# Patient Record
Sex: Female | Born: 1970
Health system: Southern US, Community
[De-identification: ages and names within clinical notes are randomized; demographics above are authoritative.]

## PROBLEM LIST (undated history)

## (undated) DIAGNOSIS — I10 Essential (primary) hypertension: Secondary | ICD-10-CM

## (undated) DIAGNOSIS — B009 Herpesviral infection, unspecified: Secondary | ICD-10-CM

## (undated) DIAGNOSIS — E079 Disorder of thyroid, unspecified: Secondary | ICD-10-CM

## (undated) HISTORY — DX: Herpesviral infection, unspecified: B00.9

## (undated) HISTORY — DX: Essential (primary) hypertension: I10

## (undated) HISTORY — PX: ABDOMINAL HYSTERECTOMY: SHX81

## (undated) HISTORY — PX: TOTAL ABDOMINAL HYSTERECTOMY: SHX209

## (undated) HISTORY — DX: Disorder of thyroid, unspecified: E07.9

---

## 2000-05-23 ENCOUNTER — Other Ambulatory Visit: Admission: RE | Admit: 2000-05-23 | Discharge: 2000-05-23 | Payer: Self-pay | Admitting: Obstetrics and Gynecology

## 2001-06-18 ENCOUNTER — Other Ambulatory Visit: Admission: RE | Admit: 2001-06-18 | Discharge: 2001-06-18 | Payer: Self-pay | Admitting: Obstetrics and Gynecology

## 2001-11-11 ENCOUNTER — Inpatient Hospital Stay (HOSPITAL_COMMUNITY): Admission: AD | Admit: 2001-11-11 | Discharge: 2001-11-11 | Payer: Self-pay | Admitting: Obstetrics & Gynecology

## 2002-01-31 ENCOUNTER — Encounter: Admission: RE | Admit: 2002-01-31 | Discharge: 2002-05-01 | Payer: Self-pay | Admitting: Obstetrics and Gynecology

## 2002-05-08 ENCOUNTER — Inpatient Hospital Stay (HOSPITAL_COMMUNITY): Admission: AD | Admit: 2002-05-08 | Discharge: 2002-05-10 | Payer: Self-pay | Admitting: Obstetrics and Gynecology

## 2002-06-18 ENCOUNTER — Other Ambulatory Visit: Admission: RE | Admit: 2002-06-18 | Discharge: 2002-06-18 | Payer: Self-pay | Admitting: Obstetrics and Gynecology

## 2003-12-23 ENCOUNTER — Other Ambulatory Visit: Admission: RE | Admit: 2003-12-23 | Discharge: 2003-12-23 | Payer: Self-pay | Admitting: Obstetrics and Gynecology

## 2004-07-06 ENCOUNTER — Other Ambulatory Visit: Admission: RE | Admit: 2004-07-06 | Discharge: 2004-07-06 | Payer: Self-pay | Admitting: Obstetrics and Gynecology

## 2005-04-16 ENCOUNTER — Ambulatory Visit (HOSPITAL_COMMUNITY): Admission: RE | Admit: 2005-04-16 | Discharge: 2005-04-16 | Payer: Self-pay | Admitting: Emergency Medicine

## 2006-03-20 ENCOUNTER — Other Ambulatory Visit: Admission: RE | Admit: 2006-03-20 | Discharge: 2006-03-20 | Payer: Self-pay | Admitting: Gynecology

## 2007-04-22 ENCOUNTER — Inpatient Hospital Stay (HOSPITAL_COMMUNITY): Admission: RE | Admit: 2007-04-22 | Discharge: 2007-04-24 | Payer: Self-pay | Admitting: Gynecology

## 2007-04-22 ENCOUNTER — Encounter: Payer: Self-pay | Admitting: Gynecology

## 2007-05-30 ENCOUNTER — Other Ambulatory Visit: Admission: RE | Admit: 2007-05-30 | Discharge: 2007-05-30 | Payer: Self-pay | Admitting: Gynecology

## 2008-10-09 DIAGNOSIS — E079 Disorder of thyroid, unspecified: Secondary | ICD-10-CM

## 2008-10-09 HISTORY — DX: Disorder of thyroid, unspecified: E07.9

## 2008-11-19 ENCOUNTER — Encounter (HOSPITAL_COMMUNITY): Admission: RE | Admit: 2008-11-19 | Discharge: 2009-02-17 | Payer: Self-pay | Admitting: Endocrinology

## 2008-12-16 ENCOUNTER — Encounter: Payer: Self-pay | Admitting: Endocrinology

## 2011-01-19 LAB — HCG, SERUM, QUALITATIVE: Preg, Serum: NEGATIVE

## 2011-02-21 NOTE — Op Note (Signed)
Wendy Craig, Wendy Craig              ACCOUNT NO.:  [id number]   MEDICAL RECORD NO.:  [id number]          PATIENT TYPE:  INP   LOCATION:  9302                          FACILITY:  [facility]   PHYSICIAN:  Timothy P. Fontaine, M.D.DATE OF BIRTH:  [birth year]   DATE OF PROCEDURE:  04/22/2007  DATE OF DISCHARGE:                               OPERATIVE REPORT   PREOPERATIVE DIAGNOSES:  Increasing menorrhagia, dysmenorrhea,  persistent ovarian cystic mass.   POSTOPERATIVE DIAGNOSES:  Endometriosis with bilateral endometriomas,  pelvic adhesions obliterating posterior cul-de-sac.   PROCEDURE:  Laparoscopy, total abdominal hysterectomy, bilateral  salpingo-oophorectomy, lysis of adhesions.   SURGEON:  Dr. Audie Box   ASSISTANT:  Dr. Eda Paschal.   ANESTHETIC:  General.   ESTIMATED BLOOD LOSS:  Approximately 500 mL   COMPLICATIONS:  None.   SPECIMEN:  Uterus, bilateral fallopian tubes, bilateral ovaries. cul de  sac cyst wall   FINDINGS:  Left ovary has 6 cm cyst adherent to the left pelvic sidewall  and posterior cul-de-sac. Right ovary approximately 4-5 cm, cystic in  nature adherent to the right pelvic sidewall and posterior cul-de-sac.  Right and left fallopian tubes both adherent to ovaries and posterior  cul-de-sac obliterating the posterior cul-de-sac.  Sigmoid colon  involved in this inflammatory mass with filmy adhesions along the  posterior uterine aspect and the sigmoid, right and left fallopian  tubes, right and left ovaries.  Uterus itself grossly normal in size,  shape and contour, mobile, covered with a brownish fibrinous exudate  consistent with endometriosis.  Anterior cul-de-sac grossly normal.  No  gross peritoneal endometriosis noted.  Upper abdominal exam was grossly  normal to visual inspection.   PROCEDURE:  The patient was taken to the operating room, underwent  general endotracheal anesthesia, placed low dorsal lithotomy position,  received abdominal perineal vaginal  preparation with Betadine solution.  Bladder emptied with indwelling Foley catheterization.  The Hulka  tenaculum placed on the cervix.  The patient was draped in usual  fashion.  A vertical infraumbilical incision was made using the 10-mm  OptiVu type direct entry trocar. The abdomen was directly entered under  direct visualization without difficulty.  The abdomen was then  insufflated and right and left 5-mm suprapubic ports were then placed  under direct visualization after transillumination for the vessels  without difficulty.  Examination of pelvic organs was then performed  with findings noted above.  An attempt to proceed with an LAVH BSO was  undertaken. The left adnexa was mobilized after lysis of the sigmoid,  posterior cul-de-sac, ovarian, fallopian tube adhesions to the retracted  the bowel using the harmonic scalpel.  The left adnexa was then freed  after identification of the ureter and the infundibulopelvic ligament  and vessels which was isolated and subsequently transected using the  harmonic scalpel.  The ovary was adherent to the posterior cul-de-sac  which was bluntly and sharply freed and it was apparent that there is  some remaining ovarian capsule had remained within the cul-de-sac with  an intense inflammatory state.  The left round ligament was transected  with the harmonic scalpel and the anterior  vesicouterine peritoneal  reflection was sharply incised along the anterior surface of the uterus  to develop the bladder flap.  Attention was then turned to the right  adnexa and upon attempting to free the adnexa became apparent that the  firm adhesions and inflammatory nature of the tissues.  It became  impossible to identify the ureter as was identified on the left. At this  point it became apparent that there was no longer prudent to proceed  with the laparoscopic portion of the case.  At this time after preparing  for exploratory laparotomy.  The abdomen sharply  entered through a  Pfannenstiel's type incision combining the two 5 mm ports into the  incision line.  The abdomen was sharply entered and a Balfour retractor  and bladder blade were placed within the incision.  The pelvis was  copiously irrigated and subsequently the ureter was identified through  blunt dissection into the pelvic sidewall on the peritoneal flap and  found to be away from the infundibulopelvic ligament vessels.  This  point the infundibulopelvic ligament vessels were doubly clamped, cut  and ligated using 0 Vicryl suture x2.  The adnexa was progressively  freed. It was at one point an endometrioma was entered to the level of  the round ligament which was transected with electrocautery.  The  parametrial tissues were developed and the uterine vessels were  identified and bilaterally clamped, cut and ligated using 0 Vicryl  suture x2.  The bladder flap was progressively developed sharply and the  uterus was progressively freed from its attachments through clamping,  cutting and ligating of the parametrial paracervical tissues using 0  Vicryl suture.  To aid in visualization, a supracervical hysterectomy  was initially performed and subsequently the cervical stump was excised  after entering the vagina sharply circumferentially excising the  cervical stump.  Right and left vaginal angle sutures were placed using  0 Vicryl suture and tagged for future reference and the vagina was  closed anterior to posterior using 0 Vicryl suture in interrupted figure-  of-eight stitch.  Reinspection of the pelvis revealed remaining apparent  endometriotic cyst wall within the obliterated cul-de-sac.  This was  bluntly and sharply removed to leave no overt remaining endometriotic or  ovarian tissue.  The pelvis was copiously irrigated and the cul-de-sac  showed adequate hemostasis to visualization.  Surgicel was placed within  the posterior cul-de-sac prophylactically given the intense  inflammatory  nature of the dissection in the event of subsequent oozing. Again no  overt oozing was noted at the time of surgery.  All pedicles were  reinspected showing adequate hemostasis. Bowel packing that was  initially placed after placement of the Balfour was removed.  The  Balfour retractor and bladder blade were removed after assuring proper  needle, sponge, instrument count x2.  The anterior fascia was  reapproximated using 0 Vicryl suture in a running interlocking stitch  starting at the angle, meeting in the middle.  The subcutaneous tissues  were irrigated.  Hemostasis achieved electrocautery and the skin was  reapproximated using 4-0 Vicryl running subcuticular stitch.  Benzoin,  Steri-Strips were applied.  The infraumbilical port was then closed  using 0 Vicryl in a subcutaneous fascial stitch followed by interrupted  4-0 Vicryl skin sutures.  Sterile dressing was applied and the patient  was placed in the supine position, awakened without difficulty and taken  to recovery room in good condition having tolerated procedure well with  an excess of 200 mL of  clear yellow urine.      Timothy P. Fontaine, M.D.  Electronically Signed     TPF/MEDQ  D:  04/22/2007  T:  04/22/2007  Job:  [id number]

## 2011-02-21 NOTE — Discharge Summary (Signed)
Wendy Craig, Wendy Craig              ACCOUNT NO.:  [id number]   MEDICAL RECORD NO.:  [id number]          PATIENT TYPE:  INP   LOCATION:  9302                          FACILITY:  [facility]   PHYSICIAN:  Timothy P. Fontaine, M.D.DATE OF BIRTH:  1971/09/24   DATE OF ADMISSION:  04/22/2007  DATE OF DISCHARGE:  04/24/2007                               DISCHARGE SUMMARY   DISCHARGE DIAGNOSES:  Metrorrhagia, dysmenorrhea, endometriosis with  endometriomas, pelvic adhesions.   PROCEDURE:  Laparoscopy, total abdominal hysterectomy, bilateral  salpingo-oophorectomy, pathology pending.   HOSPITAL COURSE:  40 year old G2, P2 female with increasing menorrhagia,  dysmenorrhea, ovarian cystic masses admitted for attempted LAVH BSO.  Upon entry with the laparoscopy there was significant severe  endometriosis, bilateral endometriomas with posterior cul-de-sac  obliteration and it was determined to be necessary to proceed with an  abdominal hysterectomy.  The patient underwent an uncomplicated TAH BSO  April 23, 2007.  The patient's postoperative course was uncomplicated.  She was noted to have a labile blood pressure she would run in the  140/80-150/90-100 range asymptomatic and historically she notes that she  does have a history of labile blood pressure but is untreated.  The  patient's preoperative hemoglobin was 11.  Her postoperative hemoglobin  was 8.8.  She was discharged on postoperative day #2 ambulating well,  tolerating a regular diet, passing flatus, voiding without difficulty.  She received routine precautions, instructions and follow-up will be  seen in the office 2 weeks after discharge and was given a prescription  for Tylox #25 one to two p.o. q.4-6 hours p.r.n. pain.  We discussed her  blood pressure, I gave her signs and symptoms of elevated blood  pressure.  She normally seen at [facility] [address].  She is able to  monitor her blood pressure at home. I have asked her to monitor blood  pressure and gave her limit ASAP call numbers and she is to follow-up  with her urgent care for continued blood pressure monitoring and  possible therapy if it does remain elevated.  The patient's questions  were answered and she will follow-up in 2 weeks.      Timothy P. Fontaine, M.D.  Electronically Signed     TPF/MEDQ  D:  04/24/2007  T:  04/24/2007  Job:  [id number]

## 2011-02-21 NOTE — H&P (Signed)
Wendy Craig, Wendy Craig              ACCOUNT NO.:  [id number]   MEDICAL RECORD NO.:  [id number]          PATIENT TYPE:  AMB   LOCATION:  SDC                           [facility]   PHYSICIAN:  Timothy P. Fontaine, M.D.DATE OF BIRTH:  1971/02/17   DATE OF ADMISSION:  04/22/2007  DATE OF DISCHARGE:                              HISTORY & PHYSICAL   CHIEF COMPLAINT:  Pelvic pain, menorrhagia, ovarian cystic mass.   HISTORY OF PRESENT ILLNESS:  A 40 year old G2 P2 female, barrier  contraception who has a long history of increasing menorrhagia that is  becoming socially unacceptable.  She has an ultrasound that shows  evidence of adenomyosis in the myometrial pattern but negative  sonohysterogram and a normal thyroid panel.  She most recently was  evaluated due to abdominal pain in March 2008 at which time she  underwent a CT which showed a 6 cm complex cystic mass on the ovary.  A  follow-up ultrasound confirmed a 6 cm complex mass, solid in appearance  with some septations, negative color flow Doppler.  She had a normal CA-  125 at 16.  The patient is admitted at this time for laparoscopic  assisted vaginal hysterectomy, bilateral salpingo-oophorectomy due to  her increasing menorrhagia, pelvic discomfort and the cystic mass.  The  patient was counseled as to the alternatives to include removal of the  cystic mass alone with a tubal sterilization; removal of the mass, tubal  sterilization, endometrial ablation up to and including hysterectomy  with removal of the right cystic mass and ovary leaving the left normal-  appearing ovary for continued hormone production.  The patient feels  very strongly that she wants both ovaries removed regardless of findings  at the time of surgery as discussed in the assessment and plan.   PAST MEDICAL HISTORY:  Is uncomplicated.   PAST SURGICAL HISTORY:  Uncomplicated.   ALLERGIES:  None.   CURRENT MEDICATIONS:  None.   REVIEW OF SYSTEMS:   Noncontributory.   SOCIAL HISTORY:  Noncontributory.   FAMILY HISTORY:  Noncontributory.   PHYSICAL EXAM:  VITAL SIGNS:  Afebrile vital signs stable.  HEENT: Normal.  LUNGS:  Clear.  CARDIAC: Regular rate.  No rubs, murmurs or gallops.  ABDOMEN:  Benign.  PELVIC:  External BUS, vagina grossly normal.  Cervix normal.  Uterus  grossly normal in size. Cul-de-sac somewhat tender to palpation.  Adnexa  without gross masses or tenderness.   ASSESSMENT:  40 year old G2, P2 female, barrier contraception increasing  menorrhagia, pelvic discomfort, cystic mass on the right ovary 6 cm for  LAVH BSO.  I reviewed the risks, benefits, alternatives for the surgery  to include more conservative approach such as removal of the cystic mass  alone with or without tubal sterilization, endometrial ablation for  menorrhagia control with removal of the mass with or without tubal  sterilization up to and including hysterectomy, BSO.  The patient feels  very strongly she wants both ovaries removed.  She had the issues of  ovarian conservation reviewed to keep one ovary for continued hormone  production with the risks of benign  or malignant ovarian disease in the  future versus removal of both ovaries and the issues of hypoestrogenemia  both on the short symptomatic standpoint, hot flashes, sleep  disturbances, sweating.  Longer-term issues such as vaginal dryness,  long-term health issues as far as heart and bones are concerned.  The  [facility] study results were reviewed with her and the issues of estrogen  replacement therapy, possible breast cancer issues, long-term as well as  the risks of thrombosis, stroke, heart attack, DVT were all reviewed.  The patient feels very strongly she wants both ovaries removed  regardless of findings at the time of surgery and will consider estrogen  replacement therapy following this.  The absolute sterility associated  with hysterectomy was reviewed with her.  She  understands and accepts  this.  Sexuality following hysterectomy was reviewed, the potential for  orgasmic dysfunction and persistent dyspareunia following hysterectomy  was discussed, understood and accepted.  The acute intraoperative and  postoperative courses were reviewed to include instrumentation, trocar  placement, insufflation, multiple port sites, use of sharp and blunt  dissection, electrocautery were all reviewed with her.  The patient  understands that we will attempt LAVH BSO but any time during the  procedure if I feel it is unsafe to proceed with the laparoscopic  approach or complications arise that we may pursue an exploratory  laparotomy and she understands and accepts this.  The acute risks of  inadvertent injury to internal organs including bowel, bladder, ureters,  vessels and nerves necessitating major exploratory reparative surgeries,  future reparative surgeries either immediately recognized or delay  recognized up to including bowel resection, ostomy formation was all  discussed, understood and accepted.  The risks of hemorrhage  necessitating transfusion and risks of transfusion including transfusion  reaction, hepatitis, HIV, Mad Cow disease and other unknown entities was  all discussed, understood and accepted.  The risks of infection both  internal requiring prolonged antibiotics, abscess formation requiring  reoperation, abscess drainage as well as insertion of incisional  complications requiring opening and draining of incisions closure by  secondary intention.  The long-term issues of hernia formation was all  discussed, understood and accepted.  Again we reviewed the issue of  bilateral salpingo-oophorectomy, particularly at her young age and the  patient is very adamant about removing both ovaries to avoid the future  risk of ovarian disease or ovarian malignancy, although she has received  this is no guarantees as far as pelvic pathology or developing  other  carcinomas and she understands, accepts this.      Timothy P. Fontaine, M.D.  Electronically Signed     TPF/MEDQ  D:  04/16/2007  T:  04/16/2007  Job:  [id number]

## 2011-07-24 LAB — CBC
Hemoglobin: 8.8 — ABNORMAL LOW
MCHC: 33.4
RBC: 3.44 — ABNORMAL LOW

## 2011-07-25 LAB — CBC
HCT: 35.2 — ABNORMAL LOW
Hemoglobin: 11.6 — ABNORMAL LOW
MCHC: 32.9
MCV: 75.2 — ABNORMAL LOW
RBC: 4.67

## 2011-12-06 ENCOUNTER — Other Ambulatory Visit: Payer: Self-pay | Admitting: Family Medicine

## 2011-12-06 DIAGNOSIS — Z1231 Encounter for screening mammogram for malignant neoplasm of breast: Secondary | ICD-10-CM

## 2011-12-12 ENCOUNTER — Ambulatory Visit (INDEPENDENT_AMBULATORY_CARE_PROVIDER_SITE_OTHER): Payer: 59 | Admitting: Family Medicine

## 2011-12-12 DIAGNOSIS — I10 Essential (primary) hypertension: Secondary | ICD-10-CM | POA: Insufficient documentation

## 2011-12-12 DIAGNOSIS — E1165 Type 2 diabetes mellitus with hyperglycemia: Secondary | ICD-10-CM | POA: Insufficient documentation

## 2011-12-12 DIAGNOSIS — E890 Postprocedural hypothyroidism: Secondary | ICD-10-CM | POA: Insufficient documentation

## 2011-12-12 DIAGNOSIS — E039 Hypothyroidism, unspecified: Secondary | ICD-10-CM | POA: Insufficient documentation

## 2011-12-12 DIAGNOSIS — B009 Herpesviral infection, unspecified: Secondary | ICD-10-CM | POA: Insufficient documentation

## 2011-12-12 MED ORDER — LEVOTHYROXINE SODIUM 50 MCG PO TABS: 50.0000 ug | ORAL_TABLET | Freq: Every day | ORAL | Status: DC

## 2011-12-12 MED ORDER — METFORMIN HCL 1000 MG PO TABS: 1000.0000 mg | ORAL_TABLET | Freq: Two times a day (BID) | ORAL | Status: DC

## 2011-12-12 NOTE — Patient Instructions (Signed)
Diets for Diabetes, Food Labeling Look at food labels to help you decide how much of a product you can eat. You will want to check the amount of total carbohydrate in a serving to see how the food fits into your meal plan. In the list of ingredients, the ingredient present in the largest amount by weight must be listed first, followed by the other ingredients in descending order. STANDARD OF IDENTITY Most products have a list of ingredients. However, foods that the Food and Drug Administration (FDA) has given a standard of identity do not need a list of ingredients. A standard of identity means that a food must contain certain ingredients if it is called a particular name. Examples are mayonnaise, peanut butter, ketchup, jelly, and cheese. LABELING TERMS There are many terms found on food labels. Some of these terms have specific definitions. Some terms are regulated by the FDA, and the FDA has clearly specified how they can be used. Others are not regulated or well-defined and can be misleading and confusing. SPECIFICALLY DEFINED TERMS Nutritive Sweetener.  A sweetener that contains calories,such as table sugar or honey.  Nonnutritive Sweetener.  A sweetener with few or no calories,such as saccharin, aspartame, sucralose, and cyclamate.  LABELING TERMS REGULATED BY THE FDA Free.  The product contains only a tiny or small amount of fat, cholesterol, sodium, sugar, or calories. For example, a "fat-free" product will contain less than 0.5 g of fat per serving.  Low.  A food described as "low" in fat, saturated fat, cholesterol, sodium, or calories could be eaten fairly often without exceeding dietary guidelines. For example, "low in fat" means no more than 3 g of fat per serving.  Lean.  "Lean" and "extra lean" are U.S. Department of Agriculture Scientist, research (physical sciences)) terms for use on meat and poultry products. "Lean" means the product contains less than 10 g of fat, 4 g of saturated fat, and 95 mg of  cholesterol per serving. "Lean" is not as low in fat as a product labeled "low."  Extra Lean.  "Extra lean" means the product contains less than 5 g of fat, 2 g of saturated fat, and 95 mg of cholesterol per serving. While "extra lean" has less fat than "lean," it is still higher in fat than a product labeled "low."  Reduced, Less, Fewer.  A diet product that contains 25% less of a nutrient or calories than the regular version. For example, hot dogs might be labeled "25% less fat than our regular hot dogs."  Light/Lite.  A diet product that contains ? fewer calories or  the fat of the original. For example, "light in sodium" means a product with  the usual sodium.  More.  One serving contains at least 10% more of the daily value of a vitamin, mineral, or fiber than usual.  Good Source Of.  One serving contains 10% to 19% of the daily value for a particular vitamin, mineral, or fiber.  Excellent Source Of.  One serving contains 20% or more of the daily value for a particular nutrient. Other terms used might be "high in" or "rich in."  Enriched or Fortified.  The product contains added vitamins, minerals, or protein. Nutrition labeling must be used on enriched or fortified foods.  Imitation.  The product has been altered so that it is lower in protein, vitamins, or minerals than the usual food,such as imitation peanut butter.  Total Fat.  The number listed is the total of all fat found in a serving  of the product. Under total fat, food labels must list saturated fat and trans fat, which are associated with raising bad cholesterol and an increased risk of heart blood vessel disease.  Saturated Fat.  Mainly fats from animal-based sources. Some examples are red meat, cheese, cream, whole milk, and coconut oil.  Trans Fat.  Found in some fried snack foods, packaged foods, and fried restaurant foods. It is recommended you eat as close to 0 g of trans fat as possible, since it raises bad  cholesterol and lowers good cholesterol.  Polyunsaturated and Monounsaturated Fats.  More healthful fats. These fats are from plant sources.  Total Carbohydrate.  The number of carbohydrate grams in a serving of the product. Under total carbohydrate are listed the other carbohydrate sources, such as dietary fiber and sugars.  Dietary Fiber.  A carbohydrate from plant sources.  Sugars.  Sugars listed on the label contain all naturally occurring sugars as well as added sugars.  LABELING TERMS NOT REGULATED BY THE FDA Sugarless.  Table sugar (sucrose) has not been added. However, the manufacturer may use another form of sugar in place of sucrose to sweeten the product. For example, sugar alcohols are used to sweeten foods. Sugar alcohols are a form of sugar but are not table sugar. If a product contains sugar alcohols in place of sucrose, it can still be labeled "sugarless."  Low Salt, Salt-Free, Unsalted, No Salt, No Salt Added, Without Added Salt.  Food that is usually processed with salt has been made without salt. However, the food may contain sodium-containing additives, such as preservatives, leavening agents, or flavorings.  Natural.  This term has no legal meaning.  Organic.  Foods that are certified as organic have been inspected and approved by the USDA to ensure they are produced without pesticides, fertilizers containing synthetic ingredients, bioengineering, or ionizing radiation.  Document Released: 09/28/2003 Document Revised: 09/14/2011 Document Reviewed: 04/15/2009 [facility] Patient Information 2012 [place], [place]    .Diabetes and Exercise Regular exercise is important and can help:   Control blood glucose (sugar).   Decrease blood pressure.    Control blood lipids (cholesterol, triglycerides).   Improve overall health.  BENEFITS FROM EXERCISE  Improved fitness.   Improved flexibility.   Improved endurance.   Increased bone density.   Weight control.    Increased muscle strength.   Decreased body fat.   Improvement of the body's use of insulin, a hormone.   Increased insulin sensitivity.   Reduction of insulin needs.   Reduced stress and tension.   Helps you feel better.  People with diabetes who add exercise to their lifestyle gain additional benefits, including:  Weight loss.   Reduced appetite.   Improvement of the body's use of blood glucose.   Decreased risk factors for heart disease:   Lowering of cholesterol and triglycerides.   Raising the level of good cholesterol (high-density lipoproteins, HDL).   Lowering blood sugar.   Decreased blood pressure.  TYPE 1 DIABETES AND EXERCISE  Exercise will usually lower your blood glucose.   If blood glucose is greater than 240 mg/dl, check urine ketones. If ketones are present, do not exercise.   Location of the insulin injection sites may need to be adjusted with exercise. Avoid injecting insulin into areas of the body that will be exercised. For example, avoid injecting insulin into:   The arms when playing tennis.   The legs when jogging. For more information, discuss this with your caregiver.   Keep a record of:  Food intake.   Type and amount of exercise.   Expected peak times of insulin action.   Blood glucose levels.  Do this before, during, and after exercise. Review your records with your caregiver. This will help you to develop guidelines for adjusting food intake and insulin amounts.  TYPE 2 DIABETES AND EXERCISE  Regular physical activity can help control blood glucose.   Exercise is important because it may:   Increase the body's sensitivity to insulin.   Improve blood glucose control.   Exercise reduces the risk of heart disease. It decreases serum cholesterol and triglycerides. It also lowers blood pressure.   Those who take insulin or oral hypoglycemic agents should watch for signs of hypoglycemia. These signs include dizziness,  shaking, sweating, chills, and confusion.   Body water is lost during exercise. It must be replaced. This will help to avoid loss of body fluids (dehydration) or heat stroke.  Be sure to talk to your caregiver before starting an exercise program to make sure it is safe for you. Remember, any activity is better than none.  Document Released: 12/16/2003 Document Revised: 09/14/2011 Document Reviewed: 04/01/2009 [facility] Patient Information 2012 [place], [place].

## 2011-12-12 NOTE — Progress Notes (Signed)
  Subjective:    Patient ID: Wendy Craig, female    DOB: 1970-12-28, 41 y.o.   MRN: [id number]  HPI  This 41 yo. AA female has Type 2 Diabetes and Hypothyroidism but has not been consistent  with her meds. She resumed them daily about 1 week ago. She has been taking her BP med daily.  She comes in today because she needs RFs for Metformin and Synthroid. She is not monitoring  FSBS at home. She denies hypoglycemic symptoms. Not following a meal plan and not exercising  regularly though she plans to do adopt healthier lifestyle in order to lose some weight . Her ultimate  goal is to be able to discontinue some medications. Foot exams reveal no lesions/ulcerd or loss  of sensation.   Review of Systems As per HPI     Objective:   Physical Exam  Nursing note and vitals reviewed. Constitutional: She is oriented to person, place, and time. She appears well-developed and well-nourished. No distress.  HENT:  Head: Normocephalic and atraumatic.  Eyes: Conjunctivae and EOM are normal. No scleral icterus.  Cardiovascular: Normal rate.   Pulmonary/Chest: Effort normal. No respiratory distress.  Neurological: She is alert and oriented to person, place, and time. No cranial nerve deficit.  Psychiatric: She has a normal mood and affect. Her behavior is normal. Thought content normal.   Results for orders placed in visit on 12/12/11  POCT GLYCOSYLATED HEMOGLOBIN (HGB A1C)      Component Value Range   Hemoglobin A1C 12.4    POCT CBG (FASTING - GLUCOSE)-MANUAL ENTRY      Component Value Range   Glucose Fasting, POC 169 (*) 70 - 99 (mg/dL)          Assessment & Plan:       1. Type II or unspecified type diabetes mellitus without mention of complication, uncontrolled  Pt advised of elevated A1c and the necessity of medication and lifestyle compliance. Need to monitor FSBS. RTC 6-8 weeks   2. Hypothyroidism  RF:  Levothyroxine at current dose.

## 2011-12-13 ENCOUNTER — Other Ambulatory Visit: Payer: Self-pay | Admitting: Family Medicine

## 2011-12-13 ENCOUNTER — Ambulatory Visit
Admission: RE | Admit: 2011-12-13 | Discharge: 2011-12-13 | Disposition: A | Discharge status: Home / Self Care | Payer: Self-pay | Source: Ambulatory Visit | Attending: Family Medicine | Admitting: Family Medicine

## 2011-12-13 DIAGNOSIS — Z1231 Encounter for screening mammogram for malignant neoplasm of breast: Secondary | ICD-10-CM

## 2011-12-14 ENCOUNTER — Other Ambulatory Visit: Payer: Self-pay | Admitting: Family Medicine

## 2011-12-14 ENCOUNTER — Telehealth: Payer: Self-pay

## 2011-12-14 DIAGNOSIS — R928 Other abnormal and inconclusive findings on diagnostic imaging of breast: Secondary | ICD-10-CM

## 2011-12-14 NOTE — Telephone Encounter (Signed)
Pt calling saying that the metphormin that we prescribed for her is not the xr so we need to change to the xr for her the other type upsets her stomache

## 2011-12-15 ENCOUNTER — Telehealth: Payer: Self-pay

## 2011-12-15 MED ORDER — METFORMIN HCL ER (MOD) 500 MG PO TB24: 500.0000 mg | ORAL_TABLET | Freq: Every day | ORAL | Status: DC

## 2011-12-15 MED ORDER — LEVOTHYROXINE SODIUM 75 MCG PO TABS: 75.0000 ug | ORAL_TABLET | Freq: Every day | ORAL | Status: DC

## 2011-12-15 NOTE — Telephone Encounter (Signed)
Pt notified that meds were sent in correctly.

## 2011-12-15 NOTE — Telephone Encounter (Signed)
Patient states Metformin is supposed to be XR so wants to know if we can send new RX in Metformin XR 1000mg  po bid and also the synthroid that was sent in is supposed to be synthroid not . Please advise

## 2011-12-15 NOTE — Telephone Encounter (Signed)
Correct meds sent in- please tell her we are sorry.

## 2011-12-15 NOTE — Telephone Encounter (Signed)
Pharmacist called to verify medications sent by Dr Audria Nine at [facility]. Pt is stating that she thought the Levothroxine was supposed to stay the same, and the Metformin was supposed to be XR, not immediate release. Gave information to change levo Rx back to 75 mcg per Dr McPherson's OV note to keep pt at current dose. Called and spoke with Dr Audria Nine at 104 to ask about Metformin, and she instr'd that she did intend to inc strength of metformin to 1000 mg BID and in doing so, also wanted it changed to immediate release instead of the XR she had been on at the 500 mg strength. Called pharmacy back and instr'd to fill as Rx'd.

## 2011-12-18 ENCOUNTER — Ambulatory Visit
Admission: RE | Admit: 2011-12-18 | Discharge: 2011-12-18 | Disposition: A | Discharge status: Home / Self Care | Payer: 59 | Source: Ambulatory Visit | Attending: Family Medicine | Admitting: Family Medicine

## 2011-12-18 DIAGNOSIS — R928 Other abnormal and inconclusive findings on diagnostic imaging of breast: Secondary | ICD-10-CM

## 2012-01-19 ENCOUNTER — Encounter: Payer: Self-pay | Admitting: Family Medicine

## 2012-01-19 ENCOUNTER — Ambulatory Visit (INDEPENDENT_AMBULATORY_CARE_PROVIDER_SITE_OTHER): Payer: 59 | Admitting: Family Medicine

## 2012-01-19 VITALS — BP 132/90 | HR 83 | Temp 97.9°F | Resp 16 | Ht 62.0 in | Wt 132.8 lb

## 2012-01-19 DIAGNOSIS — E119 Type 2 diabetes mellitus without complications: Secondary | ICD-10-CM

## 2012-01-19 DIAGNOSIS — E039 Hypothyroidism, unspecified: Secondary | ICD-10-CM

## 2012-01-19 NOTE — Progress Notes (Signed)
  Subjective:    Patient ID: Wendy Craig, female    DOB: 1971-04-04, 41 y.o.   MRN: [id number]  HPI  This 41 y.o. AA female has uncontrolled Type II Diabetes and is here today for A1c; last A1c   about 1 month ago was 12.4% Pt practices good nutrition and has found that she is very sensitive to  carbs. She limits starches and has to skip a Metformin dose if her FSBS is < 100 before a meal.  FSBS at home= 80- 140.   Review of SystemsNoncontributory    Objective:   Physical Exam  Vitals reviewed. Constitutional: She is oriented to person, place, and time. She appears well-developed and well-nourished. No distress.  HENT:  Head: Normocephalic and atraumatic.  Eyes: Conjunctivae are normal. No scleral icterus.  Cardiovascular: Normal rate.   Pulmonary/Chest: Effort normal. No respiratory distress.  Neurological: She is alert and oriented to person, place, and time. No cranial nerve deficit.    Results for orders placed in visit on 01/19/12  POCT GLYCOSYLATED HEMOGLOBIN (HGB A1C)      Component Value Range   Hemoglobin A1C 9.8           Assessment & Plan:   1. Hypothyroidism  TSH  2. Type 2 diabetes mellitus  Basic metabolic panel ; continue current medications and nutrition restrictions. Weight down ~ 10 lbs in 11 months.  RTC 3 months

## 2012-01-19 NOTE — Patient Instructions (Signed)

## 2012-01-20 LAB — BASIC METABOLIC PANEL
CO2: 26 mEq/L (ref 19–32)
Chloride: 105 mEq/L (ref 96–112)
Glucose, Bld: 132 mg/dL — ABNORMAL HIGH (ref 70–99)
Potassium: 4 mEq/L (ref 3.5–5.3)
Sodium: 140 mEq/L (ref 135–145)

## 2012-01-23 ENCOUNTER — Ambulatory Visit: Payer: 59 | Admitting: Family Medicine

## 2012-01-24 NOTE — Progress Notes (Signed)
Quick Note:  Please call pt and advise that the following labs are abnormal...  Your blood sugar is elevated ( this is no surprise as you do have diabetes). Continue all your current medications and work on healthier lifestyle, nutrition and regular exercise.  Copy of labs to pt. ______

## 2012-04-19 ENCOUNTER — Encounter: Payer: Self-pay | Admitting: Family Medicine

## 2012-04-19 ENCOUNTER — Ambulatory Visit (INDEPENDENT_AMBULATORY_CARE_PROVIDER_SITE_OTHER): Payer: 59 | Admitting: Family Medicine

## 2012-04-19 VITALS — BP 134/98 | HR 72 | Temp 98.3°F | Resp 18 | Ht 61.0 in | Wt 136.0 lb

## 2012-04-19 MED ORDER — LEVOTHYROXINE SODIUM 75 MCG PO TABS: 75.0000 ug | ORAL_TABLET | Freq: Every day | ORAL | Status: DC

## 2012-04-19 MED ORDER — METFORMIN HCL ER (MOD) 500 MG PO TB24: 500.0000 mg | ORAL_TABLET | Freq: Every day | ORAL | Status: DC

## 2012-04-19 MED ORDER — LISINOPRIL-HYDROCHLOROTHIAZIDE 20-12.5 MG PO TABS: 1.0000 | ORAL_TABLET | Freq: Every day | ORAL | Status: DC

## 2012-04-19 NOTE — Progress Notes (Signed)
S; Pt returns for DM follow-up. She admits checking sugars daily but takes medication only is sugar is elevated. No regular exercise or meal planning. Not taking anti-hypertensive daily either. Denies HA, CP or tightness, SOB, palpitations, edema, dizziness, lightheadedness, weakness or fatigue.  O: Vital signs reviewed  (BP= 134/ 98)  In NAD      WN.WD      HEENT: EOMI, conj/ sclerae clear      COR: Reg rate and rhythm      LUNGS: Reg resp rate and effort      NEURO: Nonfocal   Results for orders placed in visit on 04/19/12  POCT GLYCOSYLATED HEMOGLOBIN (HGB A1C)      Component Value Range   Hemoglobin A1C 8.9     A; Type II Diabetes- inadequate control due to noncompliance       HTN- stable but not adequately controlled   P: Compliance encouraged with medications and healthier lifestyle choices- pt agrees. Will take meds daily as prescribed. All meds refilled.  RTC in 3 months

## 2012-04-19 NOTE — Patient Instructions (Signed)

## 2012-04-20 ENCOUNTER — Other Ambulatory Visit: Payer: Self-pay | Admitting: Physician Assistant

## 2012-04-20 MED ORDER — METFORMIN HCL 1000 MG PO TABS: 1000.0000 mg | ORAL_TABLET | Freq: Two times a day (BID) | ORAL | Status: DC

## 2012-09-12 ENCOUNTER — Ambulatory Visit: Payer: 59 | Admitting: Family Medicine

## 2012-10-31 ENCOUNTER — Ambulatory Visit (INDEPENDENT_AMBULATORY_CARE_PROVIDER_SITE_OTHER): Payer: 59 | Admitting: Family Medicine

## 2012-10-31 ENCOUNTER — Encounter: Payer: Self-pay | Admitting: Family Medicine

## 2012-10-31 VITALS — BP 130/93 | HR 93 | Temp 98.0°F | Resp 16 | Ht 61.0 in | Wt 132.0 lb

## 2012-10-31 DIAGNOSIS — E039 Hypothyroidism, unspecified: Secondary | ICD-10-CM

## 2012-10-31 DIAGNOSIS — R3915 Urgency of urination: Secondary | ICD-10-CM

## 2012-10-31 DIAGNOSIS — IMO0001 Reserved for inherently not codable concepts without codable children: Secondary | ICD-10-CM

## 2012-10-31 DIAGNOSIS — T50905A Adverse effect of unspecified drugs, medicaments and biological substances, initial encounter: Secondary | ICD-10-CM

## 2012-10-31 DIAGNOSIS — T887XXA Unspecified adverse effect of drug or medicament, initial encounter: Secondary | ICD-10-CM

## 2012-10-31 LAB — POCT UA - MICROSCOPIC ONLY: Crystals, Ur, HPF, POC: NEGATIVE

## 2012-10-31 LAB — POCT URINALYSIS DIPSTICK
Bilirubin, UA: NEGATIVE
Ketones, UA: NEGATIVE
Protein, UA: NEGATIVE
Spec Grav, UA: 1.015
pH, UA: 5.5

## 2012-10-31 LAB — POCT GLYCOSYLATED HEMOGLOBIN (HGB A1C): Hemoglobin A1C: 11.8

## 2012-10-31 MED ORDER — SITAGLIPTIN PHOS-METFORMIN HCL 50-500 MG PO TABS: 1.0000 | ORAL_TABLET | Freq: Two times a day (BID) | ORAL | Status: DC

## 2012-10-31 MED ORDER — LEVOTHYROXINE SODIUM 75 MCG PO TABS: 75.0000 ug | ORAL_TABLET | Freq: Every day | ORAL | Status: DC

## 2012-10-31 MED ORDER — LISINOPRIL-HYDROCHLOROTHIAZIDE 20-12.5 MG PO TABS: 1.0000 | ORAL_TABLET | Freq: Every day | ORAL | Status: DC

## 2012-10-31 MED ORDER — CIPROFLOXACIN HCL 250 MG PO TABS: 250.0000 mg | ORAL_TABLET | Freq: Two times a day (BID) | ORAL | Status: DC

## 2012-10-31 MED ORDER — METFORMIN HCL 1000 MG PO TABS: 1000.0000 mg | ORAL_TABLET | Freq: Two times a day (BID) | ORAL | Status: DC

## 2012-10-31 NOTE — Progress Notes (Signed)
S: This 42 y.o. AA female has Type II DM; FSBS done daily w/ values in "upper 100s". Pt admits she takes Metformin only once a day (c/o side effect w/ medication- "just doesn't make me feel good"). Denies polyuria or polydipsia, diaphoresis, fatigue, GI problems, HA, dizziness, lightheadedness, weakness, numbness, tremor or syncope.  She reports frequency and mild dysuria; denies fever/chills, hematuria or flank pain.  ROS: As per HPI.  O: Filed Vitals:   10/31/12 1616  BP: 130/93  Pulse: 93  Temp: 98 F (36.7 C)  Resp: 16   GEN: In NAD; WN/WD. HENT: [place]/AT; EOMI w/ clear conj/sclerae. Oroph clear and moist. COR: RRR. LUNGS: CTA; normal resp rate and effort. ABD: Soft, flat. No tenderness, HSM or masses. BACK: No CVAT. SKIN: W&D; no rashes or erythema. NEURO: A&O x 3; CNs intact, Nonfocal.   A1c= 11.8%   A/P:  1. Urinary urgency  POCT UA - Microscopic Only, POCT urinalysis dipstick, Urine culture  2. Type II or unspecified type diabetes mellitus without mention of complication, uncontrolled - Pt is noncompliant with medication; not open to starting Insulin. POCT glycosylated hemoglobin (Hb A1C), Basic metabolic panel Change oral medication- d/c Metformin 1000 mg bid RX: Janumet (generic) 50/500 mg 1 tab bid w/ meals Emphasized compliance with nutrition and medication  3. Adverse effects of medication  Metformin at current dose in not well tolerated by pt.  4. Hypothyroidism  TSH, T3, Free

## 2012-10-31 NOTE — Patient Instructions (Addendum)
It is extremely important that you take your medications as prescribed, especially the Diabetes medication. Continue to follow a meal plan and stay active. I will see you again in 2 months.

## 2012-11-01 ENCOUNTER — Encounter: Payer: Self-pay | Admitting: Family Medicine

## 2012-11-01 LAB — BASIC METABOLIC PANEL
Glucose, Bld: 291 mg/dL — ABNORMAL HIGH (ref 70–99)
Potassium: 4 mEq/L (ref 3.5–5.3)
Sodium: 133 mEq/L — ABNORMAL LOW (ref 135–145)

## 2012-11-01 LAB — T3, FREE: T3, Free: 2.6 pg/mL (ref 2.3–4.2)

## 2012-11-01 MED ORDER — LEVOTHYROXINE SODIUM 75 MCG PO TABS: 75.0000 ug | ORAL_TABLET | Freq: Every day | ORAL | Status: DC

## 2012-11-01 NOTE — Progress Notes (Signed)
Quick Note:  Please call pt and advise that the following labs are abnormal... Thyroid results are normal (meaning you are on the right dose of medication). I will order more refills.  Kidney function is normal; of course, blood sugar was elevated on this visit. I am hoping the medication change will really improve your blood sugars and Diabetes control.  Copy to pt. ______

## 2012-11-04 ENCOUNTER — Other Ambulatory Visit: Payer: Self-pay | Admitting: Family Medicine

## 2012-11-04 ENCOUNTER — Telehealth: Payer: Self-pay

## 2012-11-04 DIAGNOSIS — N390 Urinary tract infection, site not specified: Secondary | ICD-10-CM

## 2012-11-04 LAB — URINE CULTURE

## 2012-11-04 MED ORDER — CIPROFLOXACIN HCL 250 MG PO TABS: 250.0000 mg | ORAL_TABLET | Freq: Two times a day (BID) | ORAL | Status: DC

## 2012-11-04 NOTE — Telephone Encounter (Signed)
See labs 

## 2012-11-04 NOTE — Progress Notes (Signed)
Quick Note:  Please call pt and advise that the following labs are abnormal... Urine culture did grow out 1 specific bacteria; it is sensitive to the antibiotic that I prescribed. I have prescribed an additional 9 days of the same medication. She can pick it up from her pharmacy tomorrow and continue taking it twice a day.  It would be a good idea to have a repeat urine culture in 3-4 weeks. I will place a future order for the culture.    Copy to pt. ______

## 2012-11-04 NOTE — Telephone Encounter (Signed)
PT IS CALLING BACK REGARDING LAB RESULTS, SHE WOULD LIKE A CALL BACK ON HER CELL INSTEAD OF HER HOME NUMBER AT  [phone].

## 2012-11-05 ENCOUNTER — Other Ambulatory Visit: Payer: Self-pay | Admitting: Family Medicine

## 2012-11-05 MED ORDER — PIOGLITAZONE HCL-METFORMIN HCL 15-500 MG PO TABS: 1.0000 | ORAL_TABLET | Freq: Two times a day (BID) | ORAL | Status: DC

## 2012-11-06 ENCOUNTER — Telehealth: Payer: Self-pay | Admitting: Radiology

## 2012-11-06 NOTE — Telephone Encounter (Signed)
On From To Message Type 11/05/2012 5:37 PM Maurice March, MD [facility] Clinical Message Pool Patient Calls Comment: I have changed medication to ACTOplus met ; this is a combination pill to be taken twice a day with meals. I do not know for sure if this medication is covered by her plan. She can notify us if it is not.  Left message for her to call back.

## 2013-06-09 ENCOUNTER — Other Ambulatory Visit: Payer: Self-pay

## 2013-06-09 MED ORDER — LEVOTHYROXINE SODIUM 75 MCG PO TABS: 75.0000 ug | ORAL_TABLET | Freq: Every day | ORAL | Status: DC

## 2013-06-12 ENCOUNTER — Telehealth: Payer: Self-pay | Admitting: Radiology

## 2013-06-12 MED ORDER — LISINOPRIL-HYDROCHLOROTHIAZIDE 20-12.5 MG PO TABS: 1.0000 | ORAL_TABLET | Freq: Every day | ORAL | Status: DC

## 2013-06-12 NOTE — Telephone Encounter (Signed)
Pt advised she is due for office visit/ the lisinopril has been sent, for one month supply, she is transferred to make appt.

## 2013-08-23 ENCOUNTER — Other Ambulatory Visit: Payer: Self-pay | Admitting: Family Medicine

## 2013-09-16 ENCOUNTER — Ambulatory Visit (INDEPENDENT_AMBULATORY_CARE_PROVIDER_SITE_OTHER): Payer: 59 | Admitting: Family Medicine

## 2013-09-16 ENCOUNTER — Encounter: Payer: Self-pay | Admitting: Family Medicine

## 2013-09-16 VITALS — BP 164/110 | HR 75 | Temp 98.8°F | Resp 16 | Ht 60.0 in | Wt 137.8 lb

## 2013-09-16 DIAGNOSIS — Z566 Other physical and mental strain related to work: Secondary | ICD-10-CM | POA: Insufficient documentation

## 2013-09-16 DIAGNOSIS — E039 Hypothyroidism, unspecified: Secondary | ICD-10-CM

## 2013-09-16 DIAGNOSIS — Z569 Unspecified problems related to employment: Secondary | ICD-10-CM

## 2013-09-16 DIAGNOSIS — R300 Dysuria: Secondary | ICD-10-CM

## 2013-09-16 DIAGNOSIS — I10 Essential (primary) hypertension: Secondary | ICD-10-CM

## 2013-09-16 LAB — POCT URINALYSIS DIPSTICK
Bilirubin, UA: NEGATIVE
Glucose, UA: 500
Leukocytes, UA: NEGATIVE
Nitrite, UA: NEGATIVE
Protein, UA: NEGATIVE
Spec Grav, UA: >=1.03

## 2013-09-16 LAB — LDL CHOLESTEROL, DIRECT: Direct LDL: 164 mg/dL — ABNORMAL HIGH

## 2013-09-16 LAB — COMPREHENSIVE METABOLIC PANEL
Alkaline Phosphatase: 79 U/L (ref 39–117)
BUN: 10 mg/dL (ref 6–23)
Calcium: 10.3 mg/dL (ref 8.4–10.5)
Glucose, Bld: 207 mg/dL — ABNORMAL HIGH (ref 70–99)
Sodium: 138 mEq/L (ref 135–145)
Total Bilirubin: 0.4 mg/dL (ref 0.3–1.2)

## 2013-09-16 MED ORDER — ALPRAZOLAM 0.25 MG PO TABS: ORAL_TABLET | ORAL | Status: DC

## 2013-09-16 MED ORDER — LISINOPRIL-HYDROCHLOROTHIAZIDE 20-12.5 MG PO TABS: 1.0000 | ORAL_TABLET | Freq: Every day | ORAL | Status: DC

## 2013-09-16 MED ORDER — LEVOTHYROXINE SODIUM 75 MCG PO TABS: ORAL_TABLET | ORAL | Status: DC

## 2013-09-16 MED ORDER — PIOGLITAZONE HCL-METFORMIN HCL 15-500 MG PO TABS: 1.0000 | ORAL_TABLET | Freq: Two times a day (BID) | ORAL | Status: DC

## 2013-09-16 NOTE — Patient Instructions (Signed)
Insomnia Insomnia is frequent trouble falling and/or staying asleep. Insomnia can be a long term problem or a short term problem. Both are common. Insomnia can be a short term problem when the wakefulness is related to a certain stress or worry. Long term insomnia is often related to ongoing stress during waking hours and/or poor sleeping habits. Overtime, sleep deprivation itself can make the problem worse. Every little thing feels more severe because you are overtired and your ability to cope is decreased. CAUSES   Stress, anxiety, and depression.  Poor sleeping habits.  Distractions such as TV in the bedroom.  Naps close to bedtime.  Engaging in emotionally charged conversations before bed.  Technical reading before sleep.  Alcohol and other sedatives. They may make the problem worse. They can hurt normal sleep patterns and normal dream activity.  Stimulants such as caffeine for several hours prior to bedtime.  Pain syndromes and shortness of breath can cause insomnia.  Exercise late at night.  Changing time zones may cause sleeping problems (jet lag). It is sometimes helpful to have someone observe your sleeping patterns. They should look for periods of not breathing during the night (sleep apnea). They should also look to see how long those periods last. If you live alone or observers are uncertain, you can also be observed at a sleep clinic where your sleep patterns will be professionally monitored. Sleep apnea requires a checkup and treatment. Give your caregivers your medical history. Give your caregivers observations your family has made about your sleep.  SYMPTOMS   Not feeling rested in the morning.  Anxiety and restlessness at bedtime.  Difficulty falling and staying asleep. TREATMENT   Your caregiver may prescribe treatment for an underlying medical disorders. Your caregiver can give advice or help if you are using alcohol or other drugs for self-medication. Treatment  of underlying problems will usually eliminate insomnia problems.  Medications can be prescribed for short time use. They are generally not recommended for lengthy use.  Over-the-counter sleep medicines are not recommended for lengthy use. They can be habit forming.  You can promote easier sleeping by making lifestyle changes such as:  Using relaxation techniques that help with breathing and reduce muscle tension.  Exercising earlier in the day.  Changing your diet and the time of your last meal. No night time snacks.  Establish a regular time to go to bed.  Counseling can help with stressful problems and worry.  Soothing music and white noise may be helpful if there are background noises you cannot remove.  Stop tedious detailed work at least one hour before bedtime. HOME CARE INSTRUCTIONS   Keep a diary. Inform your caregiver about your progress. This includes any medication side effects. See your caregiver regularly. Take note of:  Times when you are asleep.  Times when you are awake during the night.  The quality of your sleep.  How you feel the next day. This information will help your caregiver care for you.  Get out of bed if you are still awake after 15 minutes. Read or do some quiet activity. Keep the lights down. Wait until you feel sleepy and go back to bed.  Keep regular sleeping and waking hours. Avoid naps.  Exercise regularly.  Avoid distractions at bedtime. Distractions include watching television or engaging in any intense or detailed activity like attempting to balance the household checkbook.  Develop a bedtime ritual. Keep a familiar routine of bathing, brushing your teeth, climbing into bed at the same   time each night, listening to soothing music. Routines increase the success of falling to sleep faster.  Use relaxation techniques. This can be using breathing and muscle tension release routines. It can also include visualizing peaceful scenes. You can  also help control troubling or intruding thoughts by keeping your mind occupied with boring or repetitive thoughts like the old concept of counting sheep. You can make it more creative like imagining planting one beautiful flower after another in your backyard garden.  During your day, work to eliminate stress. When this is not possible use some of the previous suggestions to help reduce the anxiety that accompanies stressful situations. MAKE SURE YOU:   Understand these instructions.  Will watch your condition.  Will get help right away if you are not doing well or get worse. Document Released: 09/22/2000 Document Revised: 12/18/2011 Document Reviewed: 10/23/2007 [facility] Patient Information 2014 [place], [place].   Diabetes and Small Vessel Disease Small vessel disease (microvascular disease) includes nephropathy, retinopathy, and neuropathy. People with diabetes are at risk for these problems, but keeping blood glucose (sugar) controlled is helpful in preventing problems. DIABETIC KIDNEY PROBLEMS (DIABETIC NEPHROPATHY)  Diabetic nephropathy occurs in many patients with diabetes.  Damage to the small vessels in the kidneys is the leading cause of end-stage renal disease (ESRD).  Protein in the urine (albuminuria) in the range of 30 to 300 mg/24 h (microalbuminuria) is a sign of the earliest stage of diabetic nephropathy.  Good blood glucose (sugar) and blood pressure control significantly reduce the progression of nephropathy. DIABETIC EYE PROBLEMS (DIABETIC RETINOPATHY)  Diabetic retinopathy is the most common cause of new cases of blindness in adults. It is related to the number of years you have had diabetes.  Common risk factors include high blood sugar (hyperglycemia), high blood pressure (hypertension), and poorly controlled blood lipids such as high blood cholesterol (hypercholesterolemia). DIABETIC NERVE PROBLEMS (DIABETIC NEUROPATHY) Diabetic neuropathy is the most common,  long-term complication of diabetes. It is responsible for more than half of leg amputations not due to accidents. The main risk for developing diabetic neuropathy seems to be uncontrolled blood sugars. Hyperglycemia damages the nerve fibers causing sensation (feeling) problems. The closer you can keep the following guidelines, the better chance you will have avoiding problems from small vessel disease.  Working toward near normal blood glucose or as normal as possible. You will need to keep your blood glucose and A1c at the target range prescribed by your caregiver.  Keep your blood pressure less than 120/80.  Keep your low-density lipoprotein (LDL) cholesterol (one of the fats in your blood) at less than 100 mg/dL. An LDL less than 70 mg/dL may be recommended for high risk patients. You cannot change your family history, but it is important to change the risk factors that you can. Risk factors you can control include:  Controlling high blood pressure.  Stopping smoking.  Using alcohol only in moderation. Generally, this means about one drink per day for women and two drinks per day for men.  Controlling your blood lipids (cholesterol and triglycerides).  Treating heart problems, if these are contributing to risk. SEEK MEDICAL CARE IF:   You are having problems keeping your blood glucose in goal range.  You notice a change in your vision or new problems with your vision.  You have wound or sore that does not heal.  Your blood pressure is above the target range. Document Released: 09/28/2003 Document Revised: 09/11/2012 Document Reviewed: 03/05/2009 [facility] Patient Information 2014 [place], [place].  Make an effort to take care of yourself; take your medications as prescribed and eat as healthy as you can. Try to keep stress at a minimum or find healthy ways to deal with stress. I have prescribed medication to help you sleep.  The information above is included to educate you about the  consequences of poorly controlled Diabetes and high blood pressure.

## 2013-09-16 NOTE — Progress Notes (Signed)
S;  This 42 y.o. AA female has Type II DM and HTN. 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 FSBS- none to report. When I mentioned lack of DM control and medication change to include possible use of Insulin, she was not interested in discussing that. She has fatigue but denies diaphoresis, abnormal weight gain, CP or tightness, palpitations, SOB or DOE, cough, GI problems, HA, dizziness, numbness or syncope.   Pt is married and has 2 sons (11 and 86). 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  HCM: Pt plans to schedule vision evaluation in near future; she notes some blurred vision.   Patient Active Problem List   Diagnosis Date Noted  . Type 2 diabetes mellitus, uncontrolled 12/12/2011    Priority: High  . Hypothyroidism 12/12/2011    Priority: High  . HTN (hypertension) 12/12/2011    Priority: High  . 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  Medications reconciled.  ROS: AS per HPI. Pt c/o dysuria, thinks she may have a UTI.  O: Filed Vitals:   09/16/13 1622  BP: 164/110  Pulse: 75  Temp: 98.8 F (37.1 C)  Resp: 16   GEN: In NAD; WN,WD. HENT: [place]/AT; EOMI w/ clear conj/sclerae. EACs/nose/oroph clear and unremarkable. COR: RRR. No edema. LUNGS: Normal resp rate and effort. SKIN: W&D; intact w/o erythema, diaphoresis or rashes. MS: MAEs; no c/c/e.  See DM foot exam. NEURO: A&O x 3' CNs intact. Nonfocal. PSYCH: Pleasant and cooperative demeanor but slightly anxious. Towards end of visit as pt waited for blood to be drawn, pt was on cell phone and sounded somewhat agitated. She had to leave before A1c was resulted.   Results for orders placed in visit on 09/16/13  POCT GLYCOSYLATED HEMOGLOBIN (HGB A1C)      Result Value Range   Hemoglobin A1C >=14.0    POCT URINALYSIS DIPSTICK      Result Value Range   Color, UA yellow     Clarity, UA clear     Glucose, UA 500     Bilirubin, UA neg     Ketones, UA neg     Spec Grav, UA >=1.030     Blood, UA neg     pH, UA 6.5     Protein, UA neg     Urobilinogen, UA 0.2     Nitrite, UA neg     Leukocytes, UA Negative      A/P: Type II or unspecified type diabetes mellitus without mention of complication, uncontrolled - Plan: HM Diabetes Foot Exam, POCT glycosylated hemoglobin (Hb A1C), T3, Free, Comprehensive metabolic panel, T4, Free, LDL Cholesterol, Direct  Unspecified hypothyroidism - Plan: TSH, T3, Free  HTN (hypertension)- Emphasized medication compliance and stress reduction.  Work-related stress- Advised stress reduction and improving sleep hygiene. RX: Alprazolam for bedtime use.  Dysuria - Plan: POCT urinalysis dipstick  Meds ordered this encounter  Medications  . levothyroxine (SYNTHROID, LEVOTHROID) 75 MCG tablet    Sig: TAKE ONE TABLET BY MOUTH ONCE DAILY.    Dispense:  30 tablet    Refill:  3  . lisinopril-hydrochlorothiazide (PRINZIDE,ZESTORETIC) 20-12.5 MG per tablet    Sig: Take 1 tablet by mouth daily.    Dispense:  30 tablet    Refill:  3  . pioglitazone-metformin (ACTOPLUS MET) 15-500 MG per tablet    Sig: Take  1 tablet by mouth 2 (two) times daily with a meal.    Dispense:  60 tablet    Refill:  3  . ALPRAZolam (XANAX) 0.25 MG tablet    Sig: Take 1 tablet at bedtime as needed for sleep.    Dispense:  30 tablet    Refill:  0   Pt will be contacted about results of A1c and UA. She is advised to sign up for MyChart.

## 2013-09-17 ENCOUNTER — Other Ambulatory Visit: Payer: Self-pay | Admitting: Family Medicine

## 2013-09-17 ENCOUNTER — Telehealth: Payer: Self-pay | Admitting: Family Medicine

## 2013-09-17 MED ORDER — LEVOTHYROXINE SODIUM 100 MCG PO TABS: ORAL_TABLET | ORAL | Status: DC

## 2013-09-17 NOTE — Telephone Encounter (Signed)
I called pt to discuss A1c results; DM is not well controlled and I emphasized importance of medication compliance, good nutrition, exercise and stress reduction. If A1c not better at next visit, will need to seriously consider addition of Insulin. I mentioned this to pt at OV on 09/16/13. She is reluctant to start this medication.

## 2013-09-17 NOTE — Progress Notes (Signed)
Quick Note:  Please advise pt regarding following labs...  Thyroid labs indicate that you are not taking enough medication. I have increased Levothyroxine to 100 mcg 1 tablet daily. This medication is at your pharmacy for pickup. All other labs (kidney and liver functions, sodium, potassium and calcium) are normal except blood sugar elevated; your A1c > 14 %. I called and left you a message about this. The urine specimen was clear. LDL ("bad") cholesterol is above normal; this is due to uncontrolled Diabetes as well as thyroid disorder.  We will recheck all these labs at your next visit. Please call the office if you have questions or concerns. As I mentioned, if you activate the MyChart feature, you can communicate with the office via e-mail.  Copy to pt. ______

## 2013-09-17 NOTE — Progress Notes (Signed)
Labs results reviewed; Levothyroxine medication dose increased from 75 mcg to 100 mcg. Pt to be notified.

## 2013-09-18 ENCOUNTER — Ambulatory Visit: Payer: 59 | Admitting: Family Medicine

## 2014-06-15 ENCOUNTER — Other Ambulatory Visit: Payer: Self-pay | Admitting: Family Medicine

## 2014-08-03 ENCOUNTER — Other Ambulatory Visit: Payer: Self-pay | Admitting: *Deleted

## 2014-08-03 MED ORDER — LISINOPRIL-HYDROCHLOROTHIAZIDE 20-12.5 MG PO TABS: 1.0000 | ORAL_TABLET | Freq: Every day | ORAL | Status: DC

## 2014-10-13 ENCOUNTER — Other Ambulatory Visit: Payer: Self-pay | Admitting: Family Medicine

## 2014-11-06 ENCOUNTER — Other Ambulatory Visit: Payer: Self-pay | Admitting: Family Medicine

## 2014-11-09 NOTE — Telephone Encounter (Signed)
LMOM on H# (OK per HIPPA) that she is overdue for OV/LABS and to either RTC or call w/f/up plan.

## 2014-11-10 ENCOUNTER — Ambulatory Visit (INDEPENDENT_AMBULATORY_CARE_PROVIDER_SITE_OTHER): Payer: 59 | Admitting: Family Medicine

## 2014-11-10 ENCOUNTER — Encounter: Payer: Self-pay | Admitting: Family Medicine

## 2014-11-10 VITALS — BP 156/110 | HR 97 | Temp 98.5°F | Resp 16 | Ht 60.5 in | Wt 134.8 lb

## 2014-11-10 DIAGNOSIS — E038 Other specified hypothyroidism: Secondary | ICD-10-CM

## 2014-11-10 DIAGNOSIS — E890 Postprocedural hypothyroidism: Secondary | ICD-10-CM

## 2014-11-10 DIAGNOSIS — Z23 Encounter for immunization: Secondary | ICD-10-CM

## 2014-11-10 DIAGNOSIS — I10 Essential (primary) hypertension: Secondary | ICD-10-CM

## 2014-11-10 DIAGNOSIS — E1165 Type 2 diabetes mellitus with hyperglycemia: Secondary | ICD-10-CM

## 2014-11-10 DIAGNOSIS — IMO0002 Reserved for concepts with insufficient information to code with codable children: Secondary | ICD-10-CM

## 2014-11-10 LAB — COMPLETE METABOLIC PANEL WITH GFR
ALK PHOS: 43 U/L (ref 39–117)
ALT: 11 U/L (ref 0–35)
AST: 14 U/L (ref 0–37)
Albumin: 3.9 g/dL (ref 3.5–5.2)
BUN: 12 mg/dL (ref 6–23)
CHLORIDE: 106 meq/L (ref 96–112)
CO2: 26 mEq/L (ref 19–32)
Calcium: 9.6 mg/dL (ref 8.4–10.5)
Creat: 0.62 mg/dL (ref 0.50–1.10)
GFR, Est African American: >89 mL/min
GFR, Est Non African American: >89 mL/min
Glucose, Bld: 136 mg/dL — ABNORMAL HIGH (ref 70–99)
POTASSIUM: 4 meq/L (ref 3.5–5.3)
SODIUM: 138 meq/L (ref 135–145)
Total Bilirubin: 0.5 mg/dL (ref 0.2–1.2)
Total Protein: 7.2 g/dL (ref 6.0–8.3)

## 2014-11-10 LAB — POCT GLYCOSYLATED HEMOGLOBIN (HGB A1C): HEMOGLOBIN A1C: 11.5

## 2014-11-10 LAB — MAGNESIUM: Magnesium: 1.7 mg/dL (ref 1.5–2.5)

## 2014-11-10 MED ORDER — PIOGLITAZONE HCL-METFORMIN HCL 15-500 MG PO TABS: 1.0000 | ORAL_TABLET | Freq: Two times a day (BID) | ORAL | Status: DC

## 2014-11-10 MED ORDER — LISINOPRIL-HYDROCHLOROTHIAZIDE 20-12.5 MG PO TABS: 1.0000 | ORAL_TABLET | Freq: Every day | ORAL | Status: DC

## 2014-11-10 MED ORDER — LEVOTHYROXINE SODIUM 100 MCG PO TABS: 100.0000 ug | ORAL_TABLET | Freq: Every day | ORAL | Status: DC

## 2014-11-10 NOTE — Patient Instructions (Signed)
I have refilled diabetes medication. Take 1 tablet daily with your largest meal and focus on exercise and healthy nutrition. Check your blood sugars daily and bring your results/log or glucometer to your next appointment.  I have not changed your other medications.      Mediterranean Diet  Why follow it? Research shows. . Those who follow the Mediterranean diet have a reduced risk of heart disease  . The diet is associated with a reduced incidence of Parkinson's and Alzheimer's diseases . People following the diet may have longer life expectancies and lower rates of chronic diseases  . The Dietary Guidelines for Americans recommends the Mediterranean diet as an eating plan to promote health and prevent disease  What Is the Mediterranean Diet?  . Healthy eating plan based on typical foods and recipes of Mediterranean-style cooking . The diet is primarily a plant based diet; these foods should make up a majority of meals   Starches - Plant based foods should make up a majority of meals - They are an important sources of vitamins, minerals, energy, antioxidants, and fiber - Choose whole grains, foods high in fiber and minimally processed items  - Typical grain sources include wheat, oats, barley, corn, brown rice, bulgar, farro, millet, polenta, couscous  - Various types of beans include chickpeas, lentils, fava beans, black beans, white beans   Fruits  Veggies - Large quantities of antioxidant rich fruits & veggies; 6 or more servings  - Vegetables can be eaten raw or lightly drizzled with oil and cooked  - Vegetables common to the traditional Mediterranean Diet include: artichokes, arugula, beets, broccoli, brussel sprouts, cabbage, carrots, celery, collard greens, cucumbers, eggplant, kale, leeks, lemons, lettuce, mushrooms, okra, onions, peas, peppers, potatoes, pumpkin, radishes, rutabaga, shallots, spinach, sweet potatoes, turnips, zucchini - Fruits common to the Mediterranean Diet  include: apples, apricots, avocados, cherries, clementines, dates, figs, grapefruits, grapes, melons, nectarines, oranges, peaches, pears, pomegranates, strawberries, tangerines  Fats - Replace butter and margarine with healthy oils, such as olive oil, canola oil, and tahini  - Limit nuts to no more than a handful a day  - Nuts include walnuts, almonds, pecans, pistachios, pine nuts  - Limit or avoid candied, honey roasted or heavily salted nuts - Olives are central to the [employer]iterranean diet - can be eaten whole or used in a variety of dishes   Meats Protein - Limiting red meat: no more than a few times a month - When eating red meat: choose lean cuts and keep the portion to the size of deck of cards - Eggs: approx. 0 to 4 times a week  - Fish and lean poultry: at least 2 a week  - Healthy protein sources include, chicken, [place], lean beef, lamb - Increase intake of seafood such as tuna, salmon, trout, mackerel, shrimp, scallops - Avoid or limit high fat processed meats such as sausage and bacon  Dairy - Include moderate amounts of low fat dairy products  - Focus on healthy dairy such as fat free yogurt, skim milk, low or reduced fat cheese - Limit dairy products higher in fat such as whole or 2% milk, cheese, ice cream  Alcohol - Moderate amounts of red wine is ok  - No more than 5 oz daily for women (all ages) and men older than age 46  - No more than 10 oz of wine daily for men younger than 2965  Other - Limit sweets and other desserts  - Use herbs and spices instead of salt  to flavor foods  - Herbs and spices common to the traditional Mediterranean Diet include: basil, bay leaves, chives, cloves, cumin, fennel, garlic, lavender, marjoram, mint, oregano, parsley, pepper, rosemary, sage, savory, sumac, tarragon, thyme   It's not just a diet, it's a lifestyle:  . The Mediterranean diet includes lifestyle factors typical of those in the region  . Foods, drinks and meals are best eaten with  others and savored . Daily physical activity is important for overall good health . This could be strenuous exercise like running and aerobics . This could also be more leisurely activities such as walking, housework, yard-work, or taking the stairs . Moderation is the key; a balanced and healthy diet accommodates most foods and drinks . Consider portion sizes and frequency of consumption of certain foods   Meal Ideas & Options:  . Breakfast:  o Whole wheat toast or whole wheat English muffins with peanut butter & hard boiled egg o Steel cut oats topped with apples & cinnamon and skim milk  o Fresh fruit: banana, strawberries, melon, berries, peaches  o Smoothies: strawberries, bananas, greek yogurt, peanut butter o Low fat greek yogurt with blueberries and granola  o Egg white omelet with spinach and mushrooms o Breakfast couscous: whole wheat couscous, apricots, skim milk, cranberries  . Sandwiches:  o Hummus and grilled vegetables (peppers, zucchini, squash) on whole wheat bread   o Grilled chicken on whole wheat pita with lettuce, tomatoes, cucumbers or tzatziki  o Tuna salad on whole wheat bread: tuna salad made with greek yogurt, olives, red peppers, capers, green onions o Garlic rosemary lamb pita: lamb sauted with garlic, rosemary, salt & pepper; add lettuce, cucumber, greek yogurt to pita - flavor with lemon juice and black pepper  . Seafood:  o Mediterranean grilled salmon, seasoned with garlic, basil, parsley, lemon juice and black pepper o Shrimp, lemon, and spinach whole-grain pasta salad made with low fat greek yogurt  o Seared scallops with lemon orzo  o Seared tuna steaks seasoned salt, pepper, coriander topped with tomato mixture of olives, tomatoes, olive oil, minced garlic, parsley, green onions and cappers  . Meats:  o Herbed greek chicken salad with kalamata olives, cucumber, feta  o Red bell peppers stuffed with spinach, bulgur, lean ground beef (or lentils) &  topped with feta   o Kebabs: skewers of chicken, tomatoes, onions, zucchini, squash  o [place] burgers: made with red onions, mint, dill, lemon juice, feta cheese topped with roasted red peppers . Vegetarian o Cucumber salad: cucumbers, artichoke hearts, celery, red onion, feta cheese, tossed in olive oil & lemon juice  o Hummus and whole grain pita points with a greek salad (lettuce, tomato, feta, olives, cucumbers, red onion) o Lentil soup with celery, carrots made with vegetable broth, garlic, salt and pepper  o Tabouli salad: parsley, bulgur, mint, scallions, cucumbers, tomato, radishes, lemon juice, olive oil, salt and pepper. o

## 2014-11-10 NOTE — Progress Notes (Addendum)
Subjective:    Patient ID: Wendy Craig, female    DOB: May 06, 1971, 44 y.o.   MRN: [id number]  HPI  This 44 y.o. Female has uncontrolled HTN; she has not had medication in 2 days. Last visit with me in Dec 2014. Reports BP readings at home are "normal"- SBP 120-130. She denies diaphoresis, fatigue, vision disturbances, CP or tightness, palpitations, edema, HA, dizziness, numbness, weakness or syncope.  Type II DM- Pt not taking medication (ACTOplus met 15-500 MG  1 tablet twice daily). She reports hypoglycemia (FSBS ~ 80) w/ symptoms so she decreased medication to once a day. FSBS ~ 120 w/ additional changes in diet and exercise so pt stopped taking medication about 6 weeks ago. She feels confident that she can manage Diabetes w/o medication.  Pt has post-ablative hypothyroidism; she has been taking levothyroxine 100 mcg daily. She no longer sees Dr. Chalmers Cater, endocrine specialist. Pt denies fatigue, abnormal weight change, skin changes, constipation or diarrhea, confusion or problems w/ concentration. She denies needing medication for sleep difficulty.   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  Patient Active Problem List   Diagnosis Date Noted  . Type 2 diabetes mellitus, uncontrolled 12/12/2011    Priority: High  . Hypothyroidism 12/12/2011    Priority: High  . HTN (hypertension) 12/12/2011    Priority: High  . Work-related stress 09/16/2013  . HSV-2 (herpes simplex virus 2) infection 12/12/2011    Prior to Admission medications   Medication Sig Start Date End Date Taking? Authorizing Provider  ALPRAZolam Duanne Moron) 0.25 MG tablet Take 1 tablet at bedtime as needed for sleep. 09/16/13  Yes Barton Fanny, MD  levothyroxine (SYNTHROID, LEVOTHROID) 100 MCG tablet Take 1 tablet (100 mcg total) by mouth daily.  10/14/14  Yes Barton Fanny, MD  lisinopril-hydrochlorothiazide (PRINZIDE,ZESTORETIC) 20-12.5 MG per  tablet Take 1 tablet by mouth daily.   Out for 2 days Barton Fanny, MD  pioglitazone-metformin (ACTOPLUS MET) 15-500 MG per tablet Take 1 tablet by mouth 2 (two) times daily with a meal. 09/16/13  Yes Barton Fanny, MD    Past Surgical History  Procedure Laterality Date  . Abdominal hysterectomy      Review of Systems  Constitutional: Negative.   Eyes: Negative for visual disturbance.  Respiratory: Negative for cough, choking, chest tightness and shortness of breath.   Cardiovascular: Negative.   Endocrine: Negative.   Skin: Negative.   Neurological: Negative.   Psychiatric/Behavioral: Negative.        Objective:   Physical Exam  Constitutional: She is oriented to person, place, and time. She appears well-developed and well-nourished. No distress.  Blood pressure 156/110, pulse 97, temperature 98.5 F (36.9 C), temperature source Oral, resp. rate 16, height 5' 0.5" (1.537 m), weight 134 lb 12.8 oz (61.145 kg), SpO2 99 %.  I rechecked BP >>  160/90.  HENT:  Head: Normocephalic and atraumatic.  Right Ear: External ear normal.  Left Ear: External ear normal.  Nose: Nose normal.  Mouth/Throat: Oropharynx is clear and moist.  Eyes: Conjunctivae and EOM are normal. Pupils are equal, round, and reactive to light. No scleral icterus.  Neck: Normal range of motion. Neck supple.  Cardiovascular: Normal rate and regular rhythm.  Exam reveals no gallop.   No murmur heard. Pulmonary/Chest: Effort normal and breath sounds normal. No respiratory distress.  Musculoskeletal: Normal range of motion. She exhibits no edema.  Neurological: She  is alert and oriented to person, place, and time. No cranial nerve deficit. She exhibits normal muscle tone. Coordination normal.  Skin: Skin is warm and dry. She is not diaphoretic.  Psychiatric: She has a normal mood and affect. Her behavior is normal. Judgment and thought content normal.  Nursing note and vitals reviewed.   Results for  orders placed or performed in visit on 11/10/14  POCT glycosylated hemoglobin (Hb A1C)  Result Value Ref Range   Hemoglobin A1C 11.5       Assessment & Plan:  Essential hypertension - Continue Lisinopril- HCT 20-12.5 mg 1 tablet daily; monitor BP at home. Bring readings to next appt. Plan: Vitamin D, 25-hydroxy, Magnesium, COMPLETE METABOLIC PANEL WITH GFR  Type 2 diabetes mellitus, uncontrolled - Resume ACTOplus met 15-500 mg  1 tablet daily with large meal; be consistent w/ timing each day. Continue w/ meal planning and regular physical activity.  I am concerned about pt's understanding about long term consequences of chronic lack of control. Pt to bring FSBS readings to next visit. Plan: Vitamin D, 25-hydroxy, Magnesium, COMPLETE METABOLIC PANEL WITH GFR, POCT glycosylated hemoglobin (Hb A1C)  Postoperative hypothyroidism - Continue levothyroxine 100 mcg  1 tab daily. Plan: Thyroid Panel With TSH, Magnesium, COMPLETE METABOLIC PANEL WITH GFR  Need for prophylactic vaccination against Streptococcus pneumoniae (pneumococcus) - Plan: Pneumococcal conjugate vaccine 13-valent IM   Meds ordered this encounter  Medications  . lisinopril-hydrochlorothiazide (PRINZIDE,ZESTORETIC) 20-12.5 MG per tablet    Sig: Take 1 tablet by mouth daily.    Dispense:  30 tablet    Refill:  3  . levothyroxine (SYNTHROID, LEVOTHROID) 100 MCG tablet    Sig: Take 1 tablet (100 mcg total) by mouth daily.    Dispense:  30 tablet    Refill:  3  . pioglitazone-metformin (ACTOPLUS MET) 15-500 MG per tablet    Sig: Take 1 tablet by mouth 2 (two) times daily with a meal (or as directed).    Dispense:  60 tablet    Refill:  3

## 2014-11-11 ENCOUNTER — Encounter: Payer: Self-pay | Admitting: Family Medicine

## 2014-11-11 ENCOUNTER — Telehealth: Payer: Self-pay

## 2014-11-11 LAB — THYROID PANEL WITH TSH
FREE THYROXINE INDEX: 2.1 (ref 1.4–3.8)
T3 Uptake: 26 % (ref 22–35)
T4 TOTAL: 8.2 ug/dL (ref 4.5–12.0)
TSH: 2.413 u[IU]/mL (ref 0.350–4.500)

## 2014-11-11 LAB — VITAMIN D 25 HYDROXY (VIT D DEFICIENCY, FRACTURES): VIT D 25 HYDROXY: 12 ng/mL — AB (ref 30–100)

## 2014-11-11 NOTE — Telephone Encounter (Signed)
Pt states she had seen Dr.Mcpherson and her medicines were called in except her Metformin but stated it requires a prior authorization. Please call [phone]     [facility] [address]

## 2014-11-14 ENCOUNTER — Other Ambulatory Visit: Payer: Self-pay | Admitting: Family Medicine

## 2014-11-14 MED ORDER — ERGOCALCIFEROL 1.25 MG (50000 UT) PO CAPS: 50000.0000 [IU] | ORAL_CAPSULE | ORAL | Status: DC

## 2014-11-14 MED ORDER — ERGOCALCIFEROL 1.25 MG (50000 UT) PO CAPS: 50000.0000 [IU] | ORAL_CAPSULE | ORAL | Status: AC

## 2014-11-14 NOTE — Progress Notes (Signed)
Quick Note:  Please advise pt regarding following labs... Vitamin D level is very low; I am prescribing a once-a-week supplement (Vitamin D 0932350000 units). It has sent to your pharmacy. You will need to take this supplement for several months to normalize Vitamin D level. Thyroid values are normal; continue taking current dose of medication. All other lab results are normal except elevated blood sugar as we discussed. Kidney function is normal.  Copy to pt. ______

## 2014-11-16 ENCOUNTER — Encounter: Payer: Self-pay | Admitting: Family Medicine

## 2014-11-16 NOTE — Telephone Encounter (Signed)
Called pharmacy to see where her PA is so I can start on it. I called her pharmacy and it seems as if she picked up the Rx already and paid 7 dollars. The medication was covered, no PA needed.

## 2014-12-10 ENCOUNTER — Encounter: Payer: Self-pay | Admitting: Family Medicine

## 2014-12-10 NOTE — Progress Notes (Signed)
This encounter was created in error - please disregard.

## 2015-05-18 ENCOUNTER — Encounter: Payer: Self-pay | Admitting: *Deleted

## 2015-09-01 ENCOUNTER — Telehealth: Payer: Self-pay

## 2015-09-01 NOTE — Telephone Encounter (Signed)
Pt would like a refill on her levothyroxine (SYNTHROID, LEVOTHROID) 100 MCG tablet [[id number]][[id number]]. She would like us to use [facility] on [address]. Please advise at [phone]

## 2015-09-03 MED ORDER — LEVOTHYROXINE SODIUM 100 MCG PO TABS: 100.0000 ug | ORAL_TABLET | Freq: Every day | ORAL | Status: DC

## 2015-09-03 NOTE — Telephone Encounter (Signed)
[facility] [address]

## 2015-09-05 ENCOUNTER — Telehealth: Payer: Self-pay

## 2015-11-10 ENCOUNTER — Other Ambulatory Visit: Payer: Self-pay

## 2015-11-10 MED ORDER — LISINOPRIL-HYDROCHLOROTHIAZIDE 20-12.5 MG PO TABS: 1.0000 | ORAL_TABLET | Freq: Every day | ORAL | Status: DC

## 2016-05-04 ENCOUNTER — Ambulatory Visit (INDEPENDENT_AMBULATORY_CARE_PROVIDER_SITE_OTHER): Payer: 59 | Admitting: Physician Assistant

## 2016-05-04 VITALS — BP 130/88 | HR 77 | Temp 98.3°F | Resp 18 | Ht 60.0 in | Wt 132.0 lb

## 2016-05-04 DIAGNOSIS — E039 Hypothyroidism, unspecified: Secondary | ICD-10-CM

## 2016-05-04 DIAGNOSIS — I10 Essential (primary) hypertension: Secondary | ICD-10-CM

## 2016-05-04 DIAGNOSIS — E1165 Type 2 diabetes mellitus with hyperglycemia: Secondary | ICD-10-CM | POA: Diagnosis not present

## 2016-05-04 DIAGNOSIS — IMO0001 Reserved for inherently not codable concepts without codable children: Secondary | ICD-10-CM

## 2016-05-04 LAB — CBC
HEMATOCRIT: 38.1 % (ref 35.0–45.0)
Hemoglobin: 12.4 g/dL (ref 11.7–15.5)
MCH: 27.3 pg (ref 27.0–33.0)
MCHC: 32.5 g/dL (ref 32.0–36.0)
MCV: 83.9 fL (ref 80.0–100.0)
MPV: 10.6 fL (ref 7.5–12.5)
PLATELETS: 350 10*3/uL (ref 140–400)
RBC: 4.54 MIL/uL (ref 3.80–5.10)
RDW: 13.7 % (ref 11.0–15.0)
WBC: 6.2 10*3/uL (ref 3.8–10.8)

## 2016-05-04 LAB — COMPLETE METABOLIC PANEL WITH GFR
ALT: 9 U/L (ref 6–29)
AST: 12 U/L (ref 10–35)
Albumin: 4.2 g/dL (ref 3.6–5.1)
Alkaline Phosphatase: 68 U/L (ref 33–115)
BUN: 9 mg/dL (ref 7–25)
CO2: 27 mmol/L (ref 20–31)
CREATININE: 0.74 mg/dL (ref 0.50–1.10)
Calcium: 9.7 mg/dL (ref 8.6–10.2)
Chloride: 102 mmol/L (ref 98–110)
GFR, Est African American: >89 mL/min (ref >=60)
GFR, Est Non African American: >89 mL/min (ref >=60)
Glucose, Bld: 220 mg/dL — ABNORMAL HIGH (ref 65–99)
Potassium: 4.1 mmol/L (ref 3.5–5.3)
Sodium: 139 mmol/L (ref 135–146)
TOTAL PROTEIN: 7.7 g/dL (ref 6.1–8.1)
Total Bilirubin: 0.5 mg/dL (ref 0.2–1.2)

## 2016-05-04 LAB — LIPID PANEL
CHOL/HDL RATIO: 3 ratio (ref <=5.0)
Cholesterol: 207 mg/dL — ABNORMAL HIGH (ref 125–200)
HDL: 70 mg/dL (ref >=46)
LDL CALC: 126 mg/dL (ref <130)
Triglycerides: 57 mg/dL (ref <150)
VLDL: 11 mg/dL (ref <30)

## 2016-05-04 LAB — HEMOGLOBIN A1C
HEMOGLOBIN A1C: 11.7 % — AB (ref <5.7)
Mean Plasma Glucose: 289 mg/dL

## 2016-05-04 LAB — TSH: TSH: 22.31 m[IU]/L — AB

## 2016-05-04 MED ORDER — LISINOPRIL-HYDROCHLOROTHIAZIDE 20-12.5 MG PO TABS: 0.5000 | ORAL_TABLET | Freq: Every day | ORAL | 1 refills | Status: DC

## 2016-05-04 MED ORDER — PIOGLITAZONE HCL-METFORMIN HCL 15-500 MG PO TABS: 1.0000 | ORAL_TABLET | Freq: Two times a day (BID) | ORAL | 1 refills | Status: DC

## 2016-05-04 MED ORDER — LEVOTHYROXINE SODIUM 100 MCG PO TABS: 100.0000 ug | ORAL_TABLET | Freq: Every day | ORAL | 1 refills | Status: DC

## 2016-05-04 NOTE — Patient Instructions (Signed)
     IF you received an x-ray today, you will receive an invoice from [facility]. Please contact [facility] at [phone] with questions or concerns regarding your invoice.   IF you received labwork today, you will receive an invoice from [facility]. Please contact [facility] at [phone] with questions or concerns regarding your invoice.   Our billing staff will not be able to assist you with questions regarding bills from these companies.  You will be contacted with the lab results as soon as they are available. The fastest way to get your results is to activate your My Chart account. Instructions are located on the last page of this paperwork. If you have not heard from us regarding the results in 2 weeks, please contact this office.      

## 2016-05-04 NOTE — Progress Notes (Signed)
05/04/2016 2:04 PM   DOB: Feb 28, 1971 / MRN: [id number]  SUBJECTIVE:  Wendy Craig is a 45 y.o. female presenting for medication refills.  Wendy Craig is not sure what med  Wendy Craig report Wendy last time Wendy Craig took Wendy Craig Actos was last week. Wendy Craig reports Wendy Craig is not very compliant with Wendy Craig medication regimen.  Wendy Craig has been a diabetic for roughly 10 years.  Wendy Craig report getting up once at night to urinate.  Wendy Craig denies sock and glove paresthesia.  Wendy Craig reports Wendy Craig vision as "fine" today.  Wendy Craig has not been to opthalmologist in quite some time.   Wendy Craig is also out of Wendy Craig BP medication. Wendy Craig reports losing about 7 lbs since Wendy Craig last visit and Wendy Craig BP is lower at today's check.  Wendy Craig would like to stop taking Wendy Craig BP med.  Wendy Craig has a history of abdominal hysterectomy.   Wendy Craig is also out of Wendy Craig thyroid medication today.     Wendy Craig is allergic to sulfa antibiotics.   Wendy Craig  has a past medical history of Diabetes mellitus; HSV infection (          ); Hypertension; and Thyroid disease (Jan 2010).    Wendy Craig  reports that Wendy Craig has never smoked. Wendy Craig does not have any smokeless tobacco history on file. Wendy Craig  has no sexual activity history on file. Wendy Craig  has a past surgical history that includes Abdominal hysterectomy.  Wendy Craig family history includes Diabetes in Wendy Craig mother.  Review of Systems  Constitutional: Negative for chills and fever.  Respiratory: Negative for cough and shortness of breath.   Cardiovascular: Negative for chest pain and leg swelling.  Gastrointestinal: Negative for nausea.  Genitourinary: Negative for dysuria, frequency and urgency.  Musculoskeletal: Negative for myalgias.  Skin: Negative for itching and rash.  Neurological: Negative for dizziness and headaches.    Problem list and medications reviewed and updated by myself where necessary, and exist elsewhere in Wendy encounter.   OBJECTIVE:  BP 130/88 (BP Location: Left Arm, Wendy Craig Position: Sitting, Cuff Size: Normal)   Pulse 77   Temp 98.3 F (36.8 C)  (Oral)   Resp 18   Ht 5' (1.524 m)   Wt 132 lb (59.9 kg)   SpO2 100%   BMI 25.78 kg/m   Physical Exam  Constitutional: Wendy Craig is oriented to person, place, and time. Wendy Craig appears well-nourished. No distress.  Eyes: EOM are normal. Pupils are equal, round, and reactive to light.  Cardiovascular: Normal rate and regular rhythm.   Pulmonary/Chest: Effort normal and breath sounds normal.  Abdominal: Wendy Craig exhibits no distension.  Neurological: Wendy Craig is alert and oriented to person, place, and time. No cranial nerve deficit. Gait normal.  Skin: Skin is dry. Wendy Craig is not diaphoretic.  Psychiatric: Wendy Craig has a normal mood and affect.  Vitals reviewed.   Lab Results  Component Value Date   WBC 12.4 (H) 04/23/2007   HGB 8.8 DELTA CHECK NOTED (L) 04/23/2007   HCT 26.4 (L) 04/23/2007   MCV 76.7 (L) 04/23/2007   PLT 267 DELTA CHECK NOTED 04/23/2007   Lab Results  Component Value Date   ALT 11 11/10/2014   AST 14 11/10/2014   ALKPHOS 43 11/10/2014   BILITOT 0.5 11/10/2014   Lab Results  Component Value Date   NA 138 11/10/2014   K 4.0 11/10/2014   CL 106 11/10/2014   CO2 26 11/10/2014   Lab Results  Component Value Date   HGBA1C 11.5 11/10/2014   Lab Results  Component  Value Date   CREATININE 0.62 11/10/2014   Lab Results  Component Value Date   TSH 2.413 11/10/2014      No results found for this or any previous visit (from Wendy past 69 hour(s)).  No results found.  ASSESSMENT AND PLAN  Wendy Craig was seen today for medication refill.  Diagnoses and all orders for this visit:  Uncontrolled type 2 diabetes mellitus without complication, without long-term current use of insulin ([place]): It has been about 1.5 years since we have seen Wendy Craig.  I expect Wendy Craig labs to show some abnormalities. Advised that I will refill for 6 months but will need to see Wendy Craig back in three to determine what needs to be done after Wendy Craig is compliant with medication.  Wendy Craig is amenable to this plan.  -      pioglitazone-metformin (ACTOPLUS MET) 15-500 MG tablet; Take 1 tablet by mouth 2 (two) times daily with a meal. -     Lipid panel -     Hemoglobin A1c  Essential hypertension -     lisinopril-hydrochlorothiazide (PRINZIDE,ZESTORETIC) 20-12.5 MG tablet; Take 0.5 tablets by mouth daily. -     CBC -     COMPLETE METABOLIC PANEL WITH GFR  Hypothyroidism, unspecified hypothyroidism type -     levothyroxine (SYNTHROID, LEVOTHROID) 100 MCG tablet; Take 1 tablet (100 mcg total) by mouth daily. -     TSH -     Care order/instruction    Wendy Craig was advised to call or return to clinic if Wendy Craig does not see an improvement in symptoms, or to seek Wendy care of Wendy closest emergency department if Wendy Craig worsens with Wendy above plan.   Philis Fendt, MHS, PA-C [facility] 05/04/2016 2:04 PM

## 2017-07-17 ENCOUNTER — Other Ambulatory Visit: Payer: Self-pay | Admitting: Physician Assistant

## 2017-07-17 DIAGNOSIS — IMO0001 Reserved for inherently not codable concepts without codable children: Secondary | ICD-10-CM

## 2017-07-17 DIAGNOSIS — E039 Hypothyroidism, unspecified: Secondary | ICD-10-CM

## 2017-07-17 DIAGNOSIS — E1165 Type 2 diabetes mellitus with hyperglycemia: Secondary | ICD-10-CM

## 2017-07-17 DIAGNOSIS — I10 Essential (primary) hypertension: Secondary | ICD-10-CM

## 2017-09-05 ENCOUNTER — Other Ambulatory Visit: Payer: Self-pay | Admitting: Physician Assistant

## 2017-09-05 DIAGNOSIS — I10 Essential (primary) hypertension: Secondary | ICD-10-CM

## 2017-09-05 DIAGNOSIS — E039 Hypothyroidism, unspecified: Secondary | ICD-10-CM

## 2017-09-05 DIAGNOSIS — IMO0001 Reserved for inherently not codable concepts without codable children: Secondary | ICD-10-CM

## 2017-09-05 DIAGNOSIS — E1165 Type 2 diabetes mellitus with hyperglycemia: Secondary | ICD-10-CM

## 2017-09-06 ENCOUNTER — Other Ambulatory Visit: Payer: Self-pay

## 2017-09-06 ENCOUNTER — Encounter: Payer: Self-pay | Admitting: Family Medicine

## 2017-09-06 ENCOUNTER — Ambulatory Visit: Payer: 59 | Admitting: Family Medicine

## 2017-09-06 VITALS — BP 148/98 | HR 92 | Temp 98.8°F | Resp 16 | Ht 60.0 in | Wt 134.8 lb

## 2017-09-06 DIAGNOSIS — E119 Type 2 diabetes mellitus without complications: Secondary | ICD-10-CM | POA: Diagnosis not present

## 2017-09-06 DIAGNOSIS — E039 Hypothyroidism, unspecified: Secondary | ICD-10-CM

## 2017-09-06 DIAGNOSIS — I10 Essential (primary) hypertension: Secondary | ICD-10-CM

## 2017-09-06 MED ORDER — LEVOTHYROXINE SODIUM 100 MCG PO TABS: 100.0000 ug | ORAL_TABLET | Freq: Every day | ORAL | 1 refills | Status: DC

## 2017-09-06 MED ORDER — LISINOPRIL-HYDROCHLOROTHIAZIDE 20-12.5 MG PO TABS: 0.5000 | ORAL_TABLET | Freq: Every day | ORAL | 1 refills | Status: DC

## 2017-09-06 MED ORDER — SITAGLIPTIN PHOSPHATE 100 MG PO TABS: 100.0000 mg | ORAL_TABLET | Freq: Every day | ORAL | 5 refills | Status: DC

## 2017-09-06 MED ORDER — METFORMIN HCL 500 MG PO TABS: 1000.0000 mg | ORAL_TABLET | Freq: Two times a day (BID) | ORAL | 3 refills | Status: DC

## 2017-09-06 NOTE — Progress Notes (Signed)
Patient ID: Wendy Craig, female    DOB: October 01, 1971  Age: 46 y.o. MRN: [id number]  Chief Complaint  Patient presents with  . Hypertension  . Medication Refill    all meds     Subjective:   Patient is here for refill of her diabetes, thyroid, and blood pressure medicine.  She has not been taking her medicines.  She is been off the blood pressure medicine for a long time, for the thyroid worried she is not taking it in over a month, and no metformin for a week.  She does not check her sugars regularly.  She knows and understands that she needs to be taking these things, but she is failed to do so.  She is not having any other major symptoms no headaches or dizziness or chest pains or shortness of breath or GI or GU complaints.  Current allergies, medications, problem list, past/family and social histories reviewed.  Objective:  BP (!) 148/98   Pulse 92   Temp 98.8 F (37.1 C)   Resp 16   Ht 5' (1.524 m)   Wt 134 lb 12.8 oz (61.1 kg)   SpO2 99%   BMI 26.33 kg/m   Vital signs noted.  Alert and oriented.  She has seen an eye doctor a couple of months ago so I did not do a funduscopic.  Neck supple without nodes or thyromegaly.  Chest is clear to auscultation.  Heart regular without murmurs gallops or arrhythmias.  No ankle edema.  Assessment & Plan:   Assessment: 1. Type 2 diabetes mellitus without complication, without long-term current use of insulin (HCC)   2. Essential hypertension   3. Hypothyroidism, unspecified type       Plan: We have had a long and serious discussion with her about the vital need for her to take her medicines and take care of herself.  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  She does have a family.  See instructions.  I spoke with Deliah BostonMichael Clark, PA-C who will follow her in a couple of months when she returns.  Compliance is the biggest need.  Orders Placed This Encounter  Procedures  . Comprehensive  metabolic panel  . Hemoglobin A1c  . Lipid panel  . TSH    Meds ordered this encounter  Medications  . metFORMIN (GLUCOPHAGE) 500 MG tablet    Sig: Take 2 tablets (1,000 mg total) by mouth 2 (two) times daily with a meal.    Dispense:  360 tablet    Refill:  3  . sitaGLIPtin (JANUVIA) 100 MG tablet    Sig: Take 1 tablet (100 mg total) by mouth daily.    Dispense:  30 tablet    Refill:  5  . lisinopril-hydrochlorothiazide (PRINZIDE,ZESTORETIC) 20-12.5 MG tablet    Sig: Take 0.5 tablets by mouth daily.    Dispense:  45 tablet    Refill:  1    PATIENT NEEDS OFFICE VISIT FOR ADDITIONAL REFILLS  . levothyroxine (SYNTHROID, LEVOTHROID) 100 MCG tablet    Sig: Take 1 tablet (100 mcg total) by mouth daily.    Dispense:  90 tablet    Refill:  1         Patient Instructions  Go to the American diabetic Association website diabetes.org and read as much as you can  Work hard on diet and regular exercise  Take metformin 1000 mg twice daily (500 mg x2) at breakfast and supper (  start with 1 twice daily for a few days to make sure you are really adapting to it before going to 2 pills twice daily.)  After 2 weeks began taking the Januvia 100 mg 1 daily  Begin taking the lisinopril hydrochlorothiazide 1/2 tablet daily 20/12.5 mg  Resume taking your levothyroxine 100 mcg daily.  You should just begin with half a pill for about 10 days before going back up to the whole pill.  Monitor your blood sugars at least several times a week and keep a record of them and bring them into your doctor.  Return in about 2 months to follow-up.    Return in about 2 months (around 11/06/2017) for [place], PAC.   HOPPER,DAVID, MD 09/06/2017

## 2017-09-06 NOTE — Patient Instructions (Signed)
Go to the American diabetic Association website diabetes.org and read as much as you can  Work hard on diet and regular exercise  Take metformin 1000 mg twice daily (500 mg x2) at breakfast and supper (start with 1 twice daily for a few days to make sure you are really adapting to it before going to 2 pills twice daily.)  After 2 weeks began taking the Januvia 100 mg 1 daily  Begin taking the lisinopril hydrochlorothiazide 1/2 tablet daily 20/12.5 mg  Resume taking your levothyroxine 100 mcg daily.  You should just begin with half a pill for about 10 days before going back up to the whole pill.  Monitor your blood sugars at least several times a week and keep a record of them and bring them into your doctor.  Return in about 2 months to follow-up.

## 2017-09-07 LAB — COMPREHENSIVE METABOLIC PANEL
ALK PHOS: 72 IU/L (ref 39–117)
ALT: 13 IU/L (ref 0–32)
AST: 14 IU/L (ref 0–40)
Albumin/Globulin Ratio: 1.3 (ref 1.2–2.2)
Albumin: 4.4 g/dL (ref 3.5–5.5)
BUN/Creatinine Ratio: 13 (ref 9–23)
BUN: 11 mg/dL (ref 6–24)
Bilirubin Total: 0.4 mg/dL (ref 0.0–1.2)
CALCIUM: 9.9 mg/dL (ref 8.7–10.2)
CO2: 25 mmol/L (ref 20–29)
CREATININE: 0.87 mg/dL (ref 0.57–1.00)
Chloride: 99 mmol/L (ref 96–106)
GFR calc Af Amer: 91 mL/min/{1.73_m2} (ref >59)
GFR, EST NON AFRICAN AMERICAN: 79 mL/min/{1.73_m2} (ref >59)
GLUCOSE: 263 mg/dL — AB (ref 65–99)
Globulin, Total: 3.3 g/dL (ref 1.5–4.5)
Potassium: 4.3 mmol/L (ref 3.5–5.2)
SODIUM: 137 mmol/L (ref 134–144)
Total Protein: 7.7 g/dL (ref 6.0–8.5)

## 2017-09-07 LAB — TSH: TSH: 88.95 u[IU]/mL — AB (ref 0.450–4.500)

## 2017-09-07 LAB — HEMOGLOBIN A1C
ESTIMATED AVERAGE GLUCOSE: 298 mg/dL
HEMOGLOBIN A1C: 12 % — AB (ref 4.8–5.6)

## 2017-09-07 LAB — LIPID PANEL
CHOLESTEROL TOTAL: 259 mg/dL — AB (ref 100–199)
Chol/HDL Ratio: 3.5 ratio (ref 0.0–4.4)
HDL: 74 mg/dL (ref >39)
LDL Calculated: 163 mg/dL — ABNORMAL HIGH (ref 0–99)
Triglycerides: 112 mg/dL (ref 0–149)
VLDL CHOLESTEROL CAL: 22 mg/dL (ref 5–40)

## 2017-11-12 ENCOUNTER — Ambulatory Visit: Payer: 59 | Admitting: Physician Assistant

## 2018-08-24 DIAGNOSIS — R309 Painful micturition, unspecified: Secondary | ICD-10-CM | POA: Diagnosis not present

## 2018-08-24 DIAGNOSIS — I10 Essential (primary) hypertension: Secondary | ICD-10-CM | POA: Diagnosis not present

## 2018-09-09 DIAGNOSIS — B349 Viral infection, unspecified: Secondary | ICD-10-CM | POA: Diagnosis not present

## 2018-09-09 DIAGNOSIS — E119 Type 2 diabetes mellitus without complications: Secondary | ICD-10-CM | POA: Diagnosis not present

## 2018-09-09 DIAGNOSIS — R509 Fever, unspecified: Secondary | ICD-10-CM | POA: Diagnosis not present

## 2018-11-05 ENCOUNTER — Other Ambulatory Visit: Payer: Self-pay | Admitting: Family Medicine

## 2018-11-05 DIAGNOSIS — E039 Hypothyroidism, unspecified: Secondary | ICD-10-CM

## 2018-11-06 NOTE — Telephone Encounter (Signed)
Refill request for levothyroxine; last office visit 09/06/17; no upcoming visits noted; attempted to contact pt; left message on voicemail [phone]; will route to office for final disposition   Requested medication (s) are due for refill today: ?  Requested medication (s) are on the active medication list: yes  Last refill:  07/2017  Future visit scheduled: no  Notes to clinic:  Last seen by Dr Alwyn Ren 09/06/17

## 2018-11-07 DIAGNOSIS — E039 Hypothyroidism, unspecified: Secondary | ICD-10-CM | POA: Diagnosis not present

## 2018-11-07 DIAGNOSIS — E119 Type 2 diabetes mellitus without complications: Secondary | ICD-10-CM | POA: Diagnosis not present

## 2018-11-07 DIAGNOSIS — I10 Essential (primary) hypertension: Secondary | ICD-10-CM | POA: Diagnosis not present

## 2018-11-19 DIAGNOSIS — Z8639 Personal history of other endocrine, nutritional and metabolic disease: Secondary | ICD-10-CM | POA: Diagnosis not present

## 2018-11-19 DIAGNOSIS — I10 Essential (primary) hypertension: Secondary | ICD-10-CM | POA: Diagnosis not present

## 2018-11-19 DIAGNOSIS — E119 Type 2 diabetes mellitus without complications: Secondary | ICD-10-CM | POA: Diagnosis not present

## 2018-12-03 DIAGNOSIS — E113391 Type 2 diabetes mellitus with moderate nonproliferative diabetic retinopathy without macular edema, right eye: Secondary | ICD-10-CM | POA: Diagnosis not present

## 2018-12-03 DIAGNOSIS — H524 Presbyopia: Secondary | ICD-10-CM | POA: Diagnosis not present

## 2018-12-03 DIAGNOSIS — E113292 Type 2 diabetes mellitus with mild nonproliferative diabetic retinopathy without macular edema, left eye: Secondary | ICD-10-CM | POA: Diagnosis not present

## 2018-12-09 ENCOUNTER — Ambulatory Visit: Payer: 59 | Admitting: Emergency Medicine

## 2018-12-31 DIAGNOSIS — E113391 Type 2 diabetes mellitus with moderate nonproliferative diabetic retinopathy without macular edema, right eye: Secondary | ICD-10-CM | POA: Diagnosis not present

## 2019-07-19 DIAGNOSIS — E890 Postprocedural hypothyroidism: Secondary | ICD-10-CM | POA: Insufficient documentation

## 2019-07-19 DIAGNOSIS — R002 Palpitations: Secondary | ICD-10-CM | POA: Insufficient documentation

## 2019-07-20 DIAGNOSIS — E1122 Type 2 diabetes mellitus with diabetic chronic kidney disease: Secondary | ICD-10-CM | POA: Insufficient documentation

## 2019-08-13 ENCOUNTER — Other Ambulatory Visit: Payer: Self-pay

## 2019-08-13 DIAGNOSIS — Z20822 Contact with and (suspected) exposure to covid-19: Secondary | ICD-10-CM

## 2019-08-14 LAB — NOVEL CORONAVIRUS, NAA: SARS-CoV-2, NAA: NOT DETECTED

## 2020-06-23 ENCOUNTER — Observation Stay (HOSPITAL_COMMUNITY): Payer: 59

## 2020-06-23 ENCOUNTER — Other Ambulatory Visit: Payer: Self-pay

## 2020-06-23 ENCOUNTER — Emergency Department (HOSPITAL_COMMUNITY): Payer: 59

## 2020-06-23 ENCOUNTER — Observation Stay (HOSPITAL_COMMUNITY)
Admission: EM | Admit: 2020-06-23 | Discharge: 2020-06-25 | Disposition: A | Discharge status: Home / Self Care | Payer: 59 | Attending: Internal Medicine | Admitting: Internal Medicine

## 2020-06-23 ENCOUNTER — Encounter (HOSPITAL_COMMUNITY): Payer: Self-pay | Admitting: Emergency Medicine

## 2020-06-23 DIAGNOSIS — E1165 Type 2 diabetes mellitus with hyperglycemia: Secondary | ICD-10-CM

## 2020-06-23 DIAGNOSIS — Z20822 Contact with and (suspected) exposure to covid-19: Secondary | ICD-10-CM | POA: Insufficient documentation

## 2020-06-23 DIAGNOSIS — M791 Myalgia, unspecified site: Secondary | ICD-10-CM | POA: Diagnosis not present

## 2020-06-23 DIAGNOSIS — IMO0002 Reserved for concepts with insufficient information to code with codable children: Secondary | ICD-10-CM | POA: Diagnosis present

## 2020-06-23 DIAGNOSIS — I10 Essential (primary) hypertension: Secondary | ICD-10-CM | POA: Diagnosis present

## 2020-06-23 DIAGNOSIS — E119 Type 2 diabetes mellitus without complications: Secondary | ICD-10-CM | POA: Diagnosis not present

## 2020-06-23 DIAGNOSIS — E890 Postprocedural hypothyroidism: Secondary | ICD-10-CM | POA: Diagnosis present

## 2020-06-23 DIAGNOSIS — E039 Hypothyroidism, unspecified: Secondary | ICD-10-CM | POA: Diagnosis present

## 2020-06-23 DIAGNOSIS — Z79899 Other long term (current) drug therapy: Secondary | ICD-10-CM | POA: Insufficient documentation

## 2020-06-23 DIAGNOSIS — R739 Hyperglycemia, unspecified: Secondary | ICD-10-CM

## 2020-06-23 DIAGNOSIS — Z7984 Long term (current) use of oral hypoglycemic drugs: Secondary | ICD-10-CM | POA: Diagnosis not present

## 2020-06-23 DIAGNOSIS — R404 Transient alteration of awareness: Secondary | ICD-10-CM | POA: Diagnosis present

## 2020-06-23 DIAGNOSIS — R4189 Other symptoms and signs involving cognitive functions and awareness: Secondary | ICD-10-CM | POA: Diagnosis present

## 2020-06-23 DIAGNOSIS — R4020 Unspecified coma: Secondary | ICD-10-CM | POA: Diagnosis not present

## 2020-06-23 DIAGNOSIS — R4182 Altered mental status, unspecified: Secondary | ICD-10-CM | POA: Diagnosis present

## 2020-06-23 DIAGNOSIS — R569 Unspecified convulsions: Secondary | ICD-10-CM

## 2020-06-23 DIAGNOSIS — R252 Cramp and spasm: Secondary | ICD-10-CM

## 2020-06-23 LAB — URINALYSIS, ROUTINE W REFLEX MICROSCOPIC
Bacteria, UA: NONE SEEN
Bilirubin Urine: NEGATIVE
Glucose, UA: >=500 mg/dL — AB
Hgb urine dipstick: NEGATIVE
Ketones, ur: NEGATIVE mg/dL
Leukocytes,Ua: NEGATIVE
Nitrite: NEGATIVE
Protein, ur: NEGATIVE mg/dL
Specific Gravity, Urine: 1.014 (ref 1.005–1.030)
pH: 7 (ref 5.0–8.0)

## 2020-06-23 LAB — MAGNESIUM: Magnesium: 1.7 mg/dL (ref 1.7–2.4)

## 2020-06-23 LAB — I-STAT CHEM 8, ED
BUN: 13 mg/dL (ref 6–20)
Calcium, Ion: 1.21 mmol/L (ref 1.15–1.40)
Chloride: 95 mmol/L — ABNORMAL LOW (ref 98–111)
Creatinine, Ser: 0.8 mg/dL (ref 0.44–1.00)
Glucose, Bld: 639 mg/dL (ref 70–99)
HCT: 38 % (ref 36.0–46.0)
Hemoglobin: 12.9 g/dL (ref 12.0–15.0)
Potassium: 4 mmol/L (ref 3.5–5.1)
Sodium: 131 mmol/L — ABNORMAL LOW (ref 135–145)
TCO2: 23 mmol/L (ref 22–32)

## 2020-06-23 LAB — COMPREHENSIVE METABOLIC PANEL
ALT: 18 U/L (ref 0–44)
AST: 23 U/L (ref 15–41)
Albumin: 4 g/dL (ref 3.5–5.0)
Alkaline Phosphatase: 70 U/L (ref 38–126)
Anion gap: 13 (ref 5–15)
BUN: 14 mg/dL (ref 6–20)
CO2: 23 mmol/L (ref 22–32)
Calcium: 9.7 mg/dL (ref 8.9–10.3)
Chloride: 94 mmol/L — ABNORMAL LOW (ref 98–111)
Creatinine, Ser: 0.98 mg/dL (ref 0.44–1.00)
GFR calc Af Amer: >60 mL/min (ref >60)
GFR calc non Af Amer: >60 mL/min (ref >60)
Glucose, Bld: 621 mg/dL (ref 70–99)
Potassium: 4.1 mmol/L (ref 3.5–5.1)
Sodium: 130 mmol/L — ABNORMAL LOW (ref 135–145)
Total Bilirubin: 0.6 mg/dL (ref 0.3–1.2)
Total Protein: 7.5 g/dL (ref 6.5–8.1)

## 2020-06-23 LAB — CBC WITH DIFFERENTIAL/PLATELET
Abs Immature Granulocytes: 0.04 10*3/uL (ref 0.00–0.07)
Basophils Absolute: 0.1 10*3/uL (ref 0.0–0.1)
Basophils Relative: 1 %
Eosinophils Absolute: 0.1 10*3/uL (ref 0.0–0.5)
Eosinophils Relative: 1 %
HCT: 35.6 % — ABNORMAL LOW (ref 36.0–46.0)
Hemoglobin: 12.1 g/dL (ref 12.0–15.0)
Immature Granulocytes: 0 %
Lymphocytes Relative: 21 %
Lymphs Abs: 1.9 10*3/uL (ref 0.7–4.0)
MCH: 28.3 pg (ref 26.0–34.0)
MCHC: 34 g/dL (ref 30.0–36.0)
MCV: 83.4 fL (ref 80.0–100.0)
Monocytes Absolute: 0.8 10*3/uL (ref 0.1–1.0)
Monocytes Relative: 8 %
Neutro Abs: 6.2 10*3/uL (ref 1.7–7.7)
Neutrophils Relative %: 69 %
Platelets: 282 10*3/uL (ref 150–400)
RBC: 4.27 MIL/uL (ref 3.87–5.11)
RDW: 12.4 % (ref 11.5–15.5)
WBC: 9.1 10*3/uL (ref 4.0–10.5)
nRBC: 0 % (ref 0.0–0.2)

## 2020-06-23 LAB — I-STAT BETA HCG BLOOD, ED (MC, WL, AP ONLY): I-stat hCG, quantitative: <5 m[IU]/mL (ref <5)

## 2020-06-23 LAB — I-STAT VENOUS BLOOD GAS, ED
Acid-Base Excess: 2 mmol/L (ref 0.0–2.0)
Bicarbonate: 25.9 mmol/L (ref 20.0–28.0)
Calcium, Ion: 1.17 mmol/L (ref 1.15–1.40)
HCT: 38 % (ref 36.0–46.0)
Hemoglobin: 12.9 g/dL (ref 12.0–15.0)
O2 Saturation: 93 %
Potassium: 4 mmol/L (ref 3.5–5.1)
Sodium: 131 mmol/L — ABNORMAL LOW (ref 135–145)
TCO2: 27 mmol/L (ref 22–32)
pCO2, Ven: 38.3 mmHg — ABNORMAL LOW (ref 44.0–60.0)
pH, Ven: 7.438 — ABNORMAL HIGH (ref 7.250–7.430)
pO2, Ven: 66 mmHg — ABNORMAL HIGH (ref 32.0–45.0)

## 2020-06-23 LAB — GLUCOSE, CAPILLARY
Glucose-Capillary: 151 mg/dL — ABNORMAL HIGH (ref 70–99)
Glucose-Capillary: 125 mg/dL — ABNORMAL HIGH (ref 70–99)

## 2020-06-23 LAB — CBG MONITORING, ED
Glucose-Capillary: 544 mg/dL (ref 70–99)
Glucose-Capillary: 128 mg/dL — ABNORMAL HIGH (ref 70–99)
Glucose-Capillary: 151 mg/dL — ABNORMAL HIGH (ref 70–99)

## 2020-06-23 LAB — SARS CORONAVIRUS 2 BY RT PCR (HOSPITAL ORDER, PERFORMED IN ~~LOC~~ HOSPITAL LAB): SARS Coronavirus 2: NEGATIVE

## 2020-06-23 LAB — BETA-HYDROXYBUTYRIC ACID: Beta-Hydroxybutyric Acid: 0.16 mmol/L (ref 0.05–0.27)

## 2020-06-23 LAB — T4, FREE: Free T4: 0.9 ng/dL (ref 0.61–1.12)

## 2020-06-23 LAB — RAPID URINE DRUG SCREEN, HOSP PERFORMED
Amphetamines: NOT DETECTED
Barbiturates: NOT DETECTED
Benzodiazepines: NOT DETECTED
Cocaine: NOT DETECTED
Opiates: NOT DETECTED
Tetrahydrocannabinol: NOT DETECTED

## 2020-06-23 LAB — TROPONIN I (HIGH SENSITIVITY)
Troponin I (High Sensitivity): 8 ng/L (ref <18)
Troponin I (High Sensitivity): 15 ng/L (ref <18)

## 2020-06-23 LAB — CK: Total CK: 119 U/L (ref 38–234)

## 2020-06-23 LAB — HEMOGLOBIN A1C
Hgb A1c MFr Bld: 12.8 % — ABNORMAL HIGH (ref 4.8–5.6)
Mean Plasma Glucose: 320.66 mg/dL

## 2020-06-23 LAB — LACTIC ACID, PLASMA
Lactic Acid, Venous: 3 mmol/L (ref 0.5–1.9)
Lactic Acid, Venous: 2.3 mmol/L (ref 0.5–1.9)
Lactic Acid, Venous: 2.1 mmol/L (ref 0.5–1.9)
Lactic Acid, Venous: 2.1 mmol/L (ref 0.5–1.9)

## 2020-06-23 LAB — TSH: TSH: 4.969 u[IU]/mL — ABNORMAL HIGH (ref 0.350–4.500)

## 2020-06-23 MED ORDER — LORAZEPAM 2 MG/ML IJ SOLN
1.0000 mg | Freq: Once | INTRAMUSCULAR | Status: AC
  Administered 2020-06-23: 1 mg via INTRAVENOUS
  Filled 2020-06-23: qty 1

## 2020-06-23 MED ORDER — LEVOTHYROXINE SODIUM 100 MCG PO TABS: 100.0000 ug | ORAL_TABLET | Freq: Every day | ORAL | Status: DC

## 2020-06-23 MED ORDER — LORAZEPAM 2 MG/ML IJ SOLN: 1.0000 mg | INTRAMUSCULAR | Status: DC | PRN

## 2020-06-23 MED ORDER — ONDANSETRON HCL 4 MG PO TABS: 4.0000 mg | ORAL_TABLET | Freq: Four times a day (QID) | ORAL | Status: DC | PRN

## 2020-06-23 MED ORDER — ONDANSETRON HCL 4 MG/2ML IJ SOLN: 4.0000 mg | Freq: Four times a day (QID) | INTRAMUSCULAR | Status: DC | PRN

## 2020-06-23 MED ORDER — INSULIN GLARGINE 100 UNIT/ML ~~LOC~~ SOLN
10.0000 [IU] | Freq: Every day | SUBCUTANEOUS | Status: DC
  Administered 2020-06-23: 10 [IU] via SUBCUTANEOUS
  Filled 2020-06-23 (×2): qty 0.1

## 2020-06-23 MED ORDER — LISINOPRIL 20 MG PO TABS
20.0000 mg | ORAL_TABLET | Freq: Every day | ORAL | Status: DC
  Administered 2020-06-23 – 2020-06-25 (×3): 20 mg via ORAL
  Filled 2020-06-23 (×3): qty 1

## 2020-06-23 MED ORDER — INSULIN ASPART 100 UNIT/ML ~~LOC~~ SOLN
0.0000 [IU] | Freq: Three times a day (TID) | SUBCUTANEOUS | Status: DC
  Administered 2020-06-23 – 2020-06-24 (×3): 3 [IU] via SUBCUTANEOUS
  Administered 2020-06-25: 2 [IU] via SUBCUTANEOUS

## 2020-06-23 MED ORDER — POLYETHYLENE GLYCOL 3350 17 G PO PACK: 17.0000 g | PACK | Freq: Every day | ORAL | Status: DC | PRN

## 2020-06-23 MED ORDER — INSULIN ASPART 100 UNIT/ML ~~LOC~~ SOLN
0.0000 [IU] | Freq: Every day | SUBCUTANEOUS | Status: DC
  Administered 2020-06-24: 2 [IU] via SUBCUTANEOUS

## 2020-06-23 MED ORDER — LEVOTHYROXINE SODIUM 112 MCG PO TABS
112.0000 ug | ORAL_TABLET | Freq: Every day | ORAL | Status: DC
  Administered 2020-06-24 – 2020-06-25 (×2): 112 ug via ORAL
  Filled 2020-06-23 (×2): qty 1

## 2020-06-23 MED ORDER — SODIUM CHLORIDE 0.9 % IV BOLUS
1000.0000 mL | Freq: Once | INTRAVENOUS | Status: AC
  Administered 2020-06-23: 1000 mL via INTRAVENOUS

## 2020-06-23 MED ORDER — ENOXAPARIN SODIUM 40 MG/0.4ML ~~LOC~~ SOLN
40.0000 mg | Freq: Every day | SUBCUTANEOUS | Status: DC
  Administered 2020-06-23 – 2020-06-24 (×2): 40 mg via SUBCUTANEOUS
  Filled 2020-06-23 (×3): qty 0.4

## 2020-06-23 MED ORDER — DOCUSATE SODIUM 100 MG PO CAPS
100.0000 mg | ORAL_CAPSULE | Freq: Two times a day (BID) | ORAL | Status: DC
  Administered 2020-06-23 – 2020-06-25 (×4): 100 mg via ORAL
  Filled 2020-06-23 (×5): qty 1

## 2020-06-23 MED ORDER — SODIUM CHLORIDE 0.9 % IV SOLN
75.0000 mL/h | INTRAVENOUS | Status: DC
  Administered 2020-06-23: 75 mL/h via INTRAVENOUS
  Administered 2020-06-23: 20 mL/h via INTRAVENOUS
  Administered 2020-06-24 – 2020-06-25 (×2): 75 mL/h via INTRAVENOUS

## 2020-06-23 MED ORDER — ACETAMINOPHEN 325 MG PO TABS: 650.0000 mg | ORAL_TABLET | ORAL | Status: DC | PRN

## 2020-06-23 MED ORDER — LISINOPRIL-HYDROCHLOROTHIAZIDE 20-12.5 MG PO TABS: 0.5000 | ORAL_TABLET | Freq: Every day | ORAL | Status: DC

## 2020-06-23 MED ORDER — ACETAMINOPHEN 650 MG RE SUPP: 650.0000 mg | RECTAL | Status: DC | PRN

## 2020-06-23 MED ORDER — INSULIN ASPART 100 UNIT/ML ~~LOC~~ SOLN
4.0000 [IU] | Freq: Three times a day (TID) | SUBCUTANEOUS | Status: DC
  Administered 2020-06-23 – 2020-06-25 (×5): 4 [IU] via SUBCUTANEOUS

## 2020-06-23 MED ORDER — HYDROCHLOROTHIAZIDE 12.5 MG PO CAPS
12.5000 mg | ORAL_CAPSULE | Freq: Every day | ORAL | Status: DC
  Administered 2020-06-23 – 2020-06-25 (×3): 12.5 mg via ORAL
  Filled 2020-06-23 (×3): qty 1

## 2020-06-23 MED ORDER — INSULIN ASPART 100 UNIT/ML ~~LOC~~ SOLN
10.0000 [IU] | Freq: Once | SUBCUTANEOUS | Status: AC
  Administered 2020-06-23: 10 [IU] via SUBCUTANEOUS

## 2020-06-23 NOTE — Procedures (Signed)
Patient Name: Wendy Craig  MRN: [id number]  Epilepsy Attending: Charlsie Quest  Referring Physician/Provider: Dr Jonah Blue Date: 06/23/2020 Duration: 24.32 mins  Patient history: 49 y.o. female with medical history significant of post-ablative hypothyroidism (Graves disease s/p RAI in 2010); HTN; and DM presenting with AMS. Last night about 1230 she started having LLE cramps.  Her toes were curling up and her calf muscle was jerking.  It resolved with walking around on it.  She went back to bed and it came back, worse - her entire left side was stiff, she was stiff all over, foaming at the mouth, eyes fixed on the ceiling, unresponsive.  She was alert but unable to talk.  It lasted maybe 10 minutes total.  +tongue biting.  No urinary or fecal incontinence.  She felt "foggy" at first after it resolved. EEG to evaluate for seizure.  Level of alertness: Awake, asleep  AEDs during EEG study: None  Technical aspects: This EEG study was done with scalp electrodes positioned according to the 10-20 International system of electrode placement. Electrical activity was acquired at a sampling rate of 500Hz  and reviewed with a high frequency filter of 70Hz  and a low frequency filter of 1Hz . EEG data were recorded continuously and digitally stored.   Description: The posterior dominant rhythm consists of 10 Hz activity of moderate voltage (25-35 uV) seen predominantly in posterior head regions, symmetric and reactive to eye opening and eye closing. Sleep was characterized by vertex waves, sleep spindles (12 to 14 Hz), maximal frontocentral region.   Physiologic photic driving was seen during photic stimulation.  Hyperventilation was not performed.     IMPRESSION: This study is within normal limits. No seizures or epileptiform discharges were seen throughout the recording.  Khrystina Bonnes 

## 2020-06-23 NOTE — H&P (Signed)
History and Physical    DALYCE RENNE BSW:[id number] DOB: Mar 20, 1971 DOA: 06/23/2020  PCP: [facility] Consultants:  None Patient coming from:  Home - lives with husband, 2 children, and mother; NOK: Husband, [phone]  Chief Complaint:  AMS  HPI: Wendy Craig is a 49 y.o. female with medical history significant of post-ablative hypothyroidism (Graves disease s/p RAI in 2010); HTN; and DM presenting with AMS. Last night about 1230 she started having LLE cramps.  Her toes were curling up and her calf muscle was jerking.  It resolved with walking around on it.  She went back to bed and it came back, worse - her entire left side was stiff, she was stiff all over, foaming at the mouth, eyes fixed on the ceiling, unresponsive.  She was alert but unable to talk.  It lasted maybe 10 minutes total.  +tongue biting.  No urinary or fecal incontinence.  She felt "foggy" at first after it resolved.  She was not back to her usual self until they were in the ER, according to her husband.  No h/o similar.  She feels well now.    ED Course:  Carryover, per Dr. Toniann Fail:  49 year old female with known history of diabetes mellitus and hypertension was brought to the ER after patient was having leg cramps and an unresponsive episode as witnessed by patient's husband. Blood sugars are markedly elevated and was given fluids and subcu insulin. MRI brain is pending will need probably EEG and control of blood pressure and blood sugar. Not sure if patient had a seizure or not as per the ER physician.   Review of Systems: As per HPI; otherwise review of systems reviewed and negative.   Ambulatory Status:  Ambulates without assistance  COVID Vaccine Status:   Complete  Past Medical History:  Diagnosis Date  . Diabetes mellitus   . HSV infection              Infection on buttocks- June 2011  . Hypertension   . Thyroid disease Jan 2010   Graves disease: S/P I-131 in 2010    Past Surgical History:    Procedure Laterality Date  . ABDOMINAL HYSTERECTOMY      Social History   Socioeconomic History  . Marital status: Married    Spouse name: Not on file  . Number of children: Not on file  . Years of education: Not on file  . Highest education level: Not on file  Occupational History  . Occupation: foster care supervisor  Tobacco Use  . Smoking status: Never Smoker  . Smokeless tobacco: Never Used  Substance and Sexual Activity  . Alcohol use: Yes    Alcohol/week: 5.0 standard drinks    Types: 5 Glasses of wine per week  . Drug use: Not Currently  . Sexual activity: Not on file  Other Topics Concern  . Not on file  Social History Narrative  . Not on file   Social Determinants of Health   Financial Resource Strain:   . Difficulty of Paying Living Expenses: Not on file  Food Insecurity:   . Worried About Programme researcher, broadcasting/film/video in the Last Year: Not on file  . Ran Out of Food in the Last Year: Not on file  Transportation Needs:   . Lack of Transportation (Medical): Not on file  . Lack of Transportation (Non-Medical): Not on file  Physical Activity:   . Days of Exercise per Week: Not on file  . Minutes of Exercise per  Session: Not on file  Stress:   . Feeling of Stress : Not on file  Social Connections:   . Frequency of Communication with Friends and Family: Not on file  . Frequency of Social Gatherings with Friends and Family: Not on file  . Attends Religious Services: Not on file  . Active Member of Clubs or Organizations: Not on file  . Attends Banker Meetings: Not on file  . Marital Status: Not on file  Intimate Partner Violence:   . Fear of Current or Ex-Partner: Not on file  . Emotionally Abused: Not on file  . Physically Abused: Not on file  . Sexually Abused: Not on file    Allergies  Allergen Reactions  . Sulfa Antibiotics Hives and Swelling    Family History  Problem Relation Age of Onset  . Diabetes Mother   . Seizures Brother      Prior to Admission medications   Medication Sig Start Date End Date Taking? Authorizing Provider  levothyroxine (SYNTHROID, LEVOTHROID) 100 MCG tablet Take 1 tablet (100 mcg total) by mouth daily. 09/06/17  Yes Peyton Najjar, MD  linaGLIPtin-metFORMIN HCl ER (JENTADUETO XR) 02-999 MG TB24 Take 1 tablet by mouth daily.   Yes [provider]  lisinopril-hydrochlorothiazide (PRINZIDE,ZESTORETIC) 20-12.5 MG tablet Take 0.5 tablets by mouth daily. 09/06/17  Yes Peyton Najjar, MD    Physical Exam: Vitals:   06/23/20 0715 06/23/20 0735 06/23/20 0745 06/23/20 0800  BP: 133/87  (!) 132/91 (!) 139/101  Pulse: (!) 113  98 90  Resp: 16  18 (!) 23  Temp:  98.8 F (37.1 C)    TempSrc:  Oral    SpO2: 97%  98% 99%  Height:         . General:  Appears calm and comfortable and is NAD . Eyes:  PERRL, EOMI, normal lids, iris . ENT:  grossly normal hearing, lips & tongue (small area of trauma along left lateral tongue), mmm; appropriate dentition . Neck:  no LAD, masses or thyromegaly . Cardiovascular:  RRR, no m/r/g. No LE edema.  Marland Kitchen Respiratory:   CTA bilaterally with no wheezes/rales/rhonchi.  Normal respiratory effort. . Abdomen:  soft, NT, ND, NABS . Skin:  no rash or induration seen on limited exam . Musculoskeletal:  grossly normal tone BUE/BLE, good ROM, no bony abnormality . Psychiatric:  blunted mood and affect, speech fluent and appropriate, AOx3 . Neurologic:  CN 2-12 grossly intact, moves all extremities in coordinated fashion    Radiological Exams on Admission: CT Head Wo Contrast  Result Date: 06/23/2020 CLINICAL DATA:  Acute left-sided leg cramping. EXAM: CT HEAD WITHOUT CONTRAST TECHNIQUE: Contiguous axial images were obtained from the base of the skull through the vertex without intravenous contrast. COMPARISON:  None. FINDINGS: Brain: No evidence of acute infarction, hemorrhage, hydrocephalus, extra-axial collection or mass lesion/mass effect. Vascular: No  hyperdense vessel or unexpected calcification. Skull: Normal. Negative for fracture or focal lesion. Sinuses/Orbits: No acute finding. Other: None. IMPRESSION: No acute intracranial pathology. Electronically Signed   By: Aram Candela M.D.   On: 06/23/2020 03:32   MR BRAIN WO CONTRAST  Result Date: 06/23/2020 CLINICAL DATA:  Neuro deficit. Leg cramps and tingling which have resolved. Hypertension. EXAM: MRI HEAD WITHOUT CONTRAST TECHNIQUE: Multiplanar, multiecho pulse sequences of the brain and surrounding structures were obtained without intravenous contrast. COMPARISON:  Head CT from earlier today FINDINGS: Brain: No acute infarction, hemorrhage, hydrocephalus, extra-axial collection or mass lesion. Trace periventricular FLAIR hyperintensity. Normal brain  volume. Vascular: Normal flow voids. Skull and upper cervical spine: Normal marrow signal Sinuses/Orbits: Negative IMPRESSION: Negative study.  No infarct or other explanation for leg symptoms. Electronically Signed   By: Marnee Spring M.D.   On: 06/23/2020 06:50    EKG: Independently reviewed.  Sinus tachycardia with rate 129; no evidence of acute ischemia   Labs on Admission: I have personally reviewed the available labs and imaging studies at the time of the admission.  Pertinent labs:   Na++ 130 Glucose 621 CK 119 HS troponin 8, 15 Lactate 3.0, 2.3 Normal CBC TSH 4.969/free T4 0.90 Beta-hydroxybutyrate 0.16 UA: >500 glucose UDS negative ABG: 7.438/38.3/66.0/25.9/93%   Assessment/Plan Principal Problem:   Unresponsive episode Active Problems:   Type 2 diabetes mellitus, uncontrolled (HCC)   Hypothyroidism   HTN (hypertension)    Unresponsive episode -Patient without known h/o seizures (but +FH - half-brother has seizure d/o) presenting with unresponsive episode with left-sided "stiffness" and tongue biting - highly suggestive of seizure -Patient placed in observation overnight for further evaluation -Neurology  consulted -Negative MRI and CT -Will order EEG  -She also will need driving restriction for at least 6 months -Seizure precautions -Ativan prn -Will trend lactate to ensure clearance, no current concern for infection/sepsis  Uncontrolled DM -Will check A1c -hold Jentadueto -Remote A1c was 12, indicating possible long-term poor control -She appears likely to benefit from insulin -Will start Lantus 10 units daily -Cover with moderate-scale SSI including mealtime coverage -Diabetes coordinator consult requested  Hypothyroidism -Elevated TSH, indicating need for dosage adjustment (despite normal free T4) -Will increase to 112 mcg  -Needs f/u TSH in 4-6 weeks  HTN -Continue Zestoretic -Her initial symptoms may have been LLE cramping associated with diuretic, although there was no evidence of dehydration or hypokalemia on presenting labs      Note: This patient has been tested and is pending for the novel coronavirus COVID-19.  DVT prophylaxis: Lovenox  Code Status:  Full - confirmed with patient/family Family Communication: Patient's husband was present throughout evaluation Disposition Plan:  The patient is from: home  Anticipated d/c is to: home without [facility] services   Anticipated d/c date will depend on clinical response to treatment, but possibly as early as tomorrow if she has excellent response to treatment  Patient is currently: acutely ill Consults called: Neurology; DM coordinator Admission status:  It is my clinical opinion that referral for OBSERVATION is reasonable and necessary in this patient based on the above information provided. The aforementioned taken together are felt to place the patient at high risk for further clinical deterioration. However it is anticipated that the patient may be medically stable for discharge from the hospital within 24 to 48 hours.    Jonah Blue MD [facility]   How to contact the [facility] Attending or Consulting provider 7A  - 7P or covering provider during after hours 7P -7A, for this patient?  1. Check the care team in [facility] and look for a) attending/consulting TR[facility] provider listed and b) the [facility] team listed 2. Log into [email or web address] and use [facility] universal password to access. If you do not have the password, please contact the hospital operator. 3. Locate the [facility] provider you are looking for under [facility] and page to a number that you can be directly reached. 4. If you still have difficulty reaching the provider, please page the [facility] (Director on Call) for the Hospitalists listed on amion for assistance.   06/23/2020, 8:26 AM

## 2020-06-23 NOTE — ED Notes (Signed)
Pt to CT

## 2020-06-23 NOTE — Progress Notes (Signed)
EEG complete - results pending 

## 2020-06-23 NOTE — ED Provider Notes (Signed)
[place] EMERGENCY DEPARTMENT Provider Note   CSN: [id number] Arrival date & time: 06/23/20  9311     History Chief Complaint  Patient presents with  . stroke like symptoms    Wendy Craig is a 49 y.o. female.  Patient here by EMS with concern for stroke.  She awoke to go to the bathroom about 1215 had left leg cramps "charley horse" that seem to involve her entire leg.  States this then went into her left arm as well as her leg.  She was having difficulty turning her head.  Husband states that her left arm was extended above her head and she could not turn her head to the right and she was drooling with a glazed look in her eye and was unresponsive for about 5 minutes.  She did not seem to be responding during this period of time.  There was no documented seizure activity.  There was no weakness in her arms or legs.  There was no numbness or tingling.  EMS reports that she was hypertensive and hyperglycemic. They did not notice any neurological deficits.  She states she is feeling back to normal though she is still having some intermittent cramping in her left leg. CBG is high.  Patient states she is not overly compliant with her diabetes medication or hypertensive medication.  States she takes them maybe 4-5 times out of 7.  States compliance with her Synthroid medication.  She denies any headache or loss of vision.  No nausea or vomiting.  No chest pain or shortness of breath.  No abdominal pain.  No pain with urination or blood in the urine.  No fever.   The history is provided by the patient, the spouse and the EMS personnel. The history is limited by the condition of the patient.       Past Medical History:  Diagnosis Date  . Diabetes mellitus   . HSV infection              Infection on buttocks- June 2011  . Hypertension   . Thyroid disease Jan 2010   Graves disease: S/P I-131 in 2010    Patient Active Problem List   Diagnosis Date Noted  .  Work-related stress 09/16/2013  . Type 2 diabetes mellitus, uncontrolled ([place]) 12/12/2011  . Hypothyroidism 12/12/2011  . HTN (hypertension) 12/12/2011  . 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 ABDOMINAL HYSTERECTOMY       OB History   No obstetric history on file.     Family History  Problem Relation Age of Onset  . Diabetes Mother     Social History   Tobacco Use  . Smoking status: Never Smoker  . Smokeless tobacco: Never Used  Substance Use Topics  . Alcohol use: Not on file  . Drug use: Not on file    Home Medications Prior to Admission medications   Medication Sig Start Date End Date Taking? Authorizing Provider  levothyroxine (SYNTHROID, LEVOTHROID) 100 MCG tablet Take 1 tablet (100 mcg total) by mouth daily. 09/06/17   Posey Boyer, MD  lisinopril-hydrochlorothiazide (PRINZIDE,ZESTORETIC) 20-12.5 MG tablet Take 0.5 tablets by mouth daily. 09/06/17   Posey Boyer, MD  metFORMIN (GLUCOPHAGE) 500 MG tablet Take 2 tablets (1,000 mg total) by mouth 2 (two) times daily with a meal. 09/06/17   Posey Boyer, MD  pioglitazone-metformin (ACTOPLUS MET) 15-500 MG tablet Take 1  tablet by mouth 2 (two) times daily with a meal. 05/04/16   Tereasa Coop, PA-C  sitaGLIPtin (JANUVIA) 100 MG tablet Take 1 tablet (100 mg total) by mouth daily. 09/06/17   Posey Boyer, MD    Allergies    Sulfa antibiotics  Review of Systems   Review of Systems  Constitutional: Positive for fatigue. Negative for activity change, appetite change and fever.  HENT: Negative for congestion and nosebleeds.   Eyes: Negative for visual disturbance.  Respiratory: Negative for cough and shortness of breath.   Cardiovascular: Negative for chest pain and leg swelling.  Gastrointestinal: Negative for abdominal pain, nausea and vomiting.  Genitourinary: Negative for dysuria and hematuria.  Musculoskeletal: Positive for  arthralgias and myalgias.  Skin: Negative for rash.  Neurological: Positive for numbness. Negative for dizziness, weakness and headaches.   all other systems are negative except as noted in the HPI and PMH.   Physical Exam Updated Vital Signs BP (!) 138/99   Pulse 88   Resp (!) 29   Ht '4\' 11"'  (1.499 m)   SpO2 98%   BMI 27.23 kg/m   Physical Exam Vitals and nursing note reviewed.  Constitutional:      General: She is not in acute distress.    Appearance: She is well-developed.     Comments: Anxious appearing  HENT:     Head: Normocephalic and atraumatic.     Mouth/Throat:     Pharynx: No oropharyngeal exudate.  Eyes:     Conjunctiva/sclera: Conjunctivae normal.     Pupils: Pupils are equal, round, and reactive to light.  Neck:     Comments: No meningismus. Cardiovascular:     Rate and Rhythm: Regular rhythm. Tachycardia present.     Heart sounds: Normal heart sounds. No murmur heard.      Comments: Tachycardic 140s. Pulmonary:     Effort: Pulmonary effort is normal. No respiratory distress.     Breath sounds: Normal breath sounds.  Abdominal:     Palpations: Abdomen is soft.     Tenderness: There is no abdominal tenderness. There is no guarding or rebound.  Musculoskeletal:        General: No tenderness. Normal range of motion.     Cervical back: Normal range of motion and neck supple.  Skin:    General: Skin is warm.  Neurological:     General: No focal deficit present.     Mental Status: She is alert and oriented to person, place, and time. Mental status is at baseline.     Cranial Nerves: No cranial nerve deficit.     Motor: No abnormal muscle tone.     Coordination: Coordination normal.     Comments: CN 2-12 intact, no ataxia on finger to nose, no nystagmus, 5/5 strength throughout, no pronator drift,  Equal sensation of upper and lower extremities bilaterally.  Psychiatric:        Behavior: Behavior normal.     ED Results / Procedures / Treatments     Labs (all labs ordered are listed, but only abnormal results are displayed) Labs Reviewed  CBC WITH DIFFERENTIAL/PLATELET - Abnormal; Notable for the following components:      Result Value   HCT 35.6 (*)    All other components within normal limits  COMPREHENSIVE METABOLIC PANEL - Abnormal; Notable for the following components:   Sodium 130 (*)    Chloride 94 (*)    Glucose, Bld 621 (*)    All other components within normal  limits  LACTIC ACID, PLASMA - Abnormal; Notable for the following components:   Lactic Acid, Venous 3.0 (*)    All other components within normal limits  LACTIC ACID, PLASMA - Abnormal; Notable for the following components:   Lactic Acid, Venous 2.3 (*)    All other components within normal limits  TSH - Abnormal; Notable for the following components:   TSH 4.969 (*)    All other components within normal limits  URINALYSIS, ROUTINE W REFLEX MICROSCOPIC - Abnormal; Notable for the following components:   Color, Urine COLORLESS (*)    Glucose, UA >=500 (*)    All other components within normal limits  CBG MONITORING, ED - Abnormal; Notable for the following components:   Glucose-Capillary 544 (*)    All other components within normal limits  I-STAT CHEM 8, ED - Abnormal; Notable for the following components:   Sodium 131 (*)    Chloride 95 (*)    Glucose, Bld 639 (*)    All other components within normal limits  I-STAT VENOUS BLOOD GAS, ED - Abnormal; Notable for the following components:   pH, Ven 7.438 (*)    pCO2, Ven 38.3 (*)    pO2, Ven 66.0 (*)    Sodium 131 (*)    All other components within normal limits  SARS CORONAVIRUS 2 BY RT PCR (HOSPITAL ORDER, [facility])  T4, FREE  BETA-HYDROXYBUTYRIC ACID  MAGNESIUM  RAPID URINE DRUG SCREEN, HOSP PERFORMED  CK  BLOOD GAS, VENOUS  I-STAT BETA HCG BLOOD, ED ([facility], [facility], [facility] ONLY)  CBG MONITORING, ED  TROPONIN I (HIGH SENSITIVITY)  TROPONIN I (HIGH SENSITIVITY)    EKG EKG  Interpretation  Date/Time:  Wednesday June 23 2020 02:42:26 EDT Ventricular Rate:  129 PR Interval:    QRS Duration: 72 QT Interval:  314 QTC Calculation: 460 R Axis:   -57 Text Interpretation: Sinus tachycardia LAE, consider biatrial enlargement Left axis deviation Abnormal R-wave progression, early transition No previous ECGs available Confirmed by Ezequiel Essex [phone]) on 06/23/2020 2:59:51 AM   Radiology CT Head Wo Contrast  Result Date: 06/23/2020 CLINICAL DATA:  Acute left-sided leg cramping. EXAM: CT HEAD WITHOUT CONTRAST TECHNIQUE: Contiguous axial images were obtained from the base of the skull through the vertex without intravenous contrast. COMPARISON:  None. FINDINGS: Brain: No evidence of acute infarction, hemorrhage, hydrocephalus, extra-axial collection or mass lesion/mass effect. Vascular: No hyperdense vessel or unexpected calcification. Skull: Normal. Negative for fracture or focal lesion. Sinuses/Orbits: No acute finding. Other: None. IMPRESSION: No acute intracranial pathology. Electronically Signed   By: Virgina Norfolk M.D.   On: 06/23/2020 03:32    Procedures Procedures (including critical care time)  Medications Ordered in ED Medications  sodium chloride 0.9 % bolus 1,000 mL (has no administration in time range)    ED Course  I have reviewed the triage vital signs and the nursing notes.  Pertinent labs & imaging results that were available during my care of the patient were reviewed by me and considered in my medical decision making (see chart for details).    MDM Rules/Calculators/A&P                         Patient with cramping involving left leg and left arm which is now mostly resolved.  She has no focal neurological deficits.  CBG is high she is hypertensive and tachycardic. She admits to poor compliance with her medications.  No neuro deficits  on exam. Burtis Junes possible seizure versus possible electrolyte abnormality rather than CVA or  TIA.  Hyperglycemia 639 with normal anion gap. Potassium 4.0.  CT head is negative.  Patient given IV fluids as well as IV insulin.  Her electrolytes are within normal limits and her potassium is normal.  Creatinine is normal.  Heart rate has improved and blood pressure has improved.  Suspect majority of her symptoms due to hyperglycemia.  Patient states she did have pain as well as numbness and tingling.  EMS reported questionable difficulty speaking when they first arrived. Episode of unresponsiveness lasted about 5 minutes per husband.  Patient states she was aware of this but could not speak.  No seizure activity.  With uncontrolled blood sugar and blood pressure will plan admission for correction of these abnormalities.  MRI in process but doubt acute TIA or CVA.  Seizure is considered but seems less likely at this time.  Patient with no post ictal state and no acute seizure activity.  May benefit from EEG.  Discussed with Dr. Hal Hope Final Clinical Impression(s) / ED Diagnoses Final diagnoses:  None    Rx / DC Orders ED Discharge Orders    None       Zanna Hawn, Annie Main, MD 06/23/20 [phone]

## 2020-06-23 NOTE — ED Triage Notes (Signed)
Per EMS, pt from home w/ spouse, pt got up and went to the bathroom around 0015, around 0030 she started having left leg cramps which resolved .  Around 115 the cramps returned to her left arm and leg again resolving.  Pt then reports tingling that was persistent in the right leg, they too have resolved .    Pt is hypertensive at 208/135 and CBG was "HIGH".  She has had not already been compliant w/ her medication.      120-125 BPM 18 L AC

## 2020-06-24 DIAGNOSIS — R4189 Other symptoms and signs involving cognitive functions and awareness: Secondary | ICD-10-CM

## 2020-06-24 LAB — GLUCOSE, CAPILLARY
Glucose-Capillary: 200 mg/dL — ABNORMAL HIGH (ref 70–99)
Glucose-Capillary: 92 mg/dL (ref 70–99)
Glucose-Capillary: 64 mg/dL — ABNORMAL LOW (ref 70–99)
Glucose-Capillary: 89 mg/dL (ref 70–99)
Glucose-Capillary: 211 mg/dL — ABNORMAL HIGH (ref 70–99)

## 2020-06-24 LAB — BASIC METABOLIC PANEL
Anion gap: 6 (ref 5–15)
BUN: 9 mg/dL (ref 6–20)
CO2: 24 mmol/L (ref 22–32)
Calcium: 9.1 mg/dL (ref 8.9–10.3)
Chloride: 109 mmol/L (ref 98–111)
Creatinine, Ser: 0.81 mg/dL (ref 0.44–1.00)
GFR calc Af Amer: >60 mL/min (ref >60)
GFR calc non Af Amer: >60 mL/min (ref >60)
Glucose, Bld: 112 mg/dL — ABNORMAL HIGH (ref 70–99)
Potassium: 3.5 mmol/L (ref 3.5–5.1)
Sodium: 139 mmol/L (ref 135–145)

## 2020-06-24 MED ORDER — INSULIN GLARGINE 100 UNIT/ML ~~LOC~~ SOLN
20.0000 [IU] | Freq: Every day | SUBCUTANEOUS | Status: DC
  Administered 2020-06-24 – 2020-06-25 (×2): 20 [IU] via SUBCUTANEOUS
  Filled 2020-06-24 (×2): qty 0.2

## 2020-06-24 NOTE — TOC Progression Note (Addendum)
Transition of Care [facility]) - Progression Note    Patient Details  Name: Wendy Craig MRN: [id number] Date of Birth: 1970-11-05  Transition of Care [facility]) CM/SW Contact  Nadene Rubins Adria Devon, RN Phone Number: 06/24/2020, 11:09 AM  Clinical Narrative:     Spoke to patient and her husband at bedside regarding PCP.   NCM explained if she knows of a MD that she is interested in establishing care with she (or NCM) can call directly to see if that office is accepting new patient's with her insurance.   Patient can also call number on insurance card and be provided a list of MD's in network with her insurance.   Patient voiced understanding. She would like to call and speak to some friends/family.   Patient has made an appointment with Dr Kateri Plummer 08/23/20 at 1010.  Lujean Rave requesting follow up appointment sooner then above . Scheduled appointment [facility] July 09, 2020 at 0920 am   Expected Discharge Plan: Home/Self Care Barriers to Discharge: Continued Medical Work up  Expected Discharge Plan and Services Expected Discharge Plan: Home/Self Care In-house Referral: PCP / [facility] Discharge Planning Services: CM Consult Post Acute Care Choice:  (PCP) Living arrangements for the past 2 months: Single Family Home                 DME Arranged: N/A DME Agency: NA       HH Arranged: NA           Social Determinants of Health (SDOH) Interventions    Readmission Risk Interventions No flowsheet data found.

## 2020-06-24 NOTE — Progress Notes (Signed)
PROGRESS NOTE    Wendy Craig  WER:[id number] DOB: Jan 03, 1971 DOA: 06/23/2020 PCP: Patient, No Pcp Per    Brief Narrative:  Wendy Craig is a 49 year old female with past medical history notable for post-ablative hypothyroidism (Graves disease s/p RAI in 2010); HTN; and DM presenting with AMS. Last night about 1230 she started having LLE cramps.  Her toes were curling up and her calf muscle was jerking.  It resolved with walking around on it.  She went back to bed and it came back, worse - her entire left side was stiff, she was stiff all over, foaming at the mouth, eyes fixed on the ceiling, unresponsive.  She was alert but unable to talk.  It lasted maybe 10 minutes total.  +tongue biting.  No urinary or fecal incontinence.  She felt "foggy" at first after it resolved.  She was not back to her usual self until they were in the ER, according to her husband.  No h/o similar.  In the ED, patient was noted to have a markedly elevated glucose of 621.  CT head without contrast unrevealing.  Concern patient with seizure, and TRH consulted for further evaluation and treatment.   Assessment & Plan:   Principal Problem:   Unresponsive episode Active Problems:   Type 2 diabetes mellitus, uncontrolled (HCC)   Hypothyroidism   HTN (hypertension)   Acute metabolic encephalopathy: Resolved Patient presenting to the ED with unresponsive episode with left-sided stiffness and tongue biting.  No previous history of seizure disorder.  CT head with no acute intracranial abnormality.  MR brain with no acute finding.  EEG with no epileptiform or seizure-like activity.  Urinalysis unrevealing.  UDS negative.  TSH slightly elevated 4.969 with a free T4 of 0.90.  Unlikely seizure given negative work-up.  Most likely etiology is severely elevated glucose causing altered sensorium.  Patient's glucose is now markedly improved with insulin and mentation, motor function is at baseline.  Type 2 diabetes  mellitus, uncontrolled At home patient linagliptin/Metformin 02-999 mg p.o. daily.  Was noted to have an elevated glucose of 621 on arrival with beta hydroxybutyrate acid 0.16 and normal anion gap.  Hemoglobin A1c 12.8 correlating with extremely poor control.  --Diabetic educator following, appreciate assistance --Increased Lantus to 20 units subcutaneously daily --Continue insulin sliding scale for further coverage --Has follow-up scheduled at patient care center with Dionisio David on 08/09/2020 at 9:20 AM and Dr. Orland Mustard on 08/23/2020 at 10:10 AM. --Pending endocrinology referral with Dr. Kelton Pillar, pending appointment --Plan to discharge with libre lifestyle glucose monitor --Continue monitor CBGs before every meal/at bedtime  Hypothyroidism TSH slightly elevated 4.969 with a free T4 of 0.90.  On levothyroxine 100 mcg p.o. daily at home. --Increase levothyroxine to 112 mcg p.o. daily --Will need repeat TFTs 4-6 weeks --Hope to get outpatient follow-up with endocrinology as above  Essential hypertension --Continue lisinopril 20 mg p.o. daily, HCTZ 12.5 mg p.o. daily  DVT prophylaxis: Lovenox Code Status: Full code Family Communication: Discussed with spouse present at bedside  Disposition Plan:  Status is: Observation  The patient remains OBS appropriate and will d/c before 2 midnights.  Dispo: The patient is from: Home              Anticipated d/c is to: Home              Anticipated d/c date is: 1 day              Patient currently is not medically stable  to d/c.   Consultants:   None  Procedures:   None  Antimicrobials:   None   Subjective: Patient seen and examined bedside, resting comfortably.  Mentation and cramps now have resolved.  Glucose is much better controlled.  Has utilized an additional 21 units of coverage insulin over the past 24 hours.  Plan to increase Lantus to 20 units of tensely daily.  Met with diabetic educator at bedside who plans placement of  libre glucose monitor.  Social work has set up outpatient follow-up, diabetic coordinator assisting with endocrinology referral.  Patient and husband updated at bedside extensively with no further questions.  Patient denies headache, no fever/chills/night sweats, no nausea/vomiting/diarrhea, no chest pain, palpitations, no shortness of breath, no abdominal pain, no weakness, no fatigue, no paresthesias.  No acute events overnight per nursing staff.  Objective: Vitals:   06/23/20 1847 06/23/20 2345 06/24/20 0528 06/24/20 1139  BP:  114/86 126/89 (!) 148/93  Pulse:  76 69 78  Resp:  '18 18 18  ' Temp:  98.5 F (36.9 C) 98.4 F (36.9 C) 98.6 F (37 C)  TempSrc:  Oral Oral Oral  SpO2:  98% 97% 98%  Weight: 55.9 kg     Height: '4\' 11"'  (1.499 m)       Intake/Output Summary (Last 24 hours) at 06/24/2020 1551 Last data filed at 06/23/2020 2256 Gross per 24 hour  Intake 240 ml  Output --  Net 240 ml   Filed Weights   06/23/20 1801 06/23/20 1847  Weight: 55.9 kg 55.9 kg    Examination:  General exam: Appears calm and comfortable  Respiratory system: Clear to auscultation. Respiratory effort normal. Cardiovascular system: S1 & S2 heard, RRR. No JVD, murmurs, rubs, gallops or clicks. No pedal edema. Gastrointestinal system: Abdomen is nondistended, soft and nontender. No organomegaly or masses felt. Normal bowel sounds heard. Central nervous system: Alert and oriented. No focal neurological deficits. Extremities: Symmetric 5 x 5 power. Skin: No rashes, lesions or ulcers Psychiatry: Judgement and insight appear normal. Mood & affect appropriate.     Data Reviewed: I have personally reviewed following labs and imaging studies  CBC: Recent Labs  Lab 06/23/20 0306 06/23/20 0310 06/23/20 0318  WBC 9.1  --   --   NEUTROABS 6.2  --   --   HGB 12.1 12.9 12.9  HCT 35.6* 38.0 38.0  MCV 83.4  --   --   PLT 282  --   --    Basic Metabolic Panel: Recent Labs  Lab 06/23/20 0306  06/23/20 0310 06/23/20 0318 06/24/20 0946  NA 130* 131* 131* 139  K 4.1 4.0 4.0 3.5  CL 94* 95*  --  109  CO2 23  --   --  24  GLUCOSE 621* 639*  --  112*  BUN 14 13  --  9  CREATININE 0.98 0.80  --  0.81  CALCIUM 9.7  --   --  9.1  MG 1.7  --   --   --    GFR: Estimated Creatinine Clearance: 62.7 mL/min (by C-G formula based on SCr of 0.81 mg/dL). Liver Function Tests: Recent Labs  Lab 06/23/20 0306  AST 23  ALT 18  ALKPHOS 70  BILITOT 0.6  PROT 7.5  ALBUMIN 4.0   No results for input(s): LIPASE, AMYLASE in the last 168 hours. No results for input(s): AMMONIA in the last 168 hours. Coagulation Profile: No results for input(s): INR, PROTIME in the last 168 hours. Cardiac Enzymes:  Recent Labs  Lab 06/23/20 0306  CKTOTAL 119   BNP (last 3 results) No results for input(s): PROBNP in the last 8760 hours. HbA1C: Recent Labs    06/23/20 0306  HGBA1C 12.8*   CBG: Recent Labs  Lab 06/23/20 1134 06/23/20 1616 06/23/20 2127 06/24/20 0609 06/24/20 1105  GLUCAP 151* 151* 125* 200* 92   Lipid Profile: No results for input(s): CHOL, HDL, LDLCALC, TRIG, CHOLHDL, LDLDIRECT in the last 72 hours. Thyroid Function Tests: Recent Labs    06/23/20 0306  TSH 4.969*  FREET4 0.90   Anemia Panel: No results for input(s): VITAMINB12, FOLATE, FERRITIN, TIBC, IRON, RETICCTPCT in the last 72 hours. Sepsis Labs: Recent Labs  Lab 06/23/20 0307 06/23/20 0501 06/23/20 0849 06/23/20 1355  LATICACIDVEN 3.0* 2.3* 2.1* 2.1*    Recent Results (from the past 240 hour(s))  SARS Coronavirus 2 by RT PCR (hospital order, performed in [facility] hospital lab) Nasopharyngeal Nasopharyngeal Swab     Status: None   Collection Time: 06/23/20  7:41 AM   Specimen: Nasopharyngeal Swab  Result Value Ref Range Status   SARS Coronavirus 2 NEGATIVE NEGATIVE Final    Comment: (NOTE) SARS-CoV-2 target nucleic acids are NOT DETECTED.  The SARS-CoV-2 RNA is generally detectable in upper and  lower respiratory specimens during the acute phase of infection. The lowest concentration of SARS-CoV-2 viral copies this assay can detect is 250 copies / mL. A negative result does not preclude SARS-CoV-2 infection and should not be used as the sole basis for treatment or other patient management decisions.  A negative result may occur with improper specimen collection / handling, submission of specimen other than nasopharyngeal swab, presence of viral mutation(s) within the areas targeted by this assay, and inadequate number of viral copies (<250 copies / mL). A negative result must be combined with clinical observations, patient history, and epidemiological information.  Fact Sheet for Patients:   StrictlyIdeas.no  Fact Sheet for Healthcare Providers: BankingDealers.co.za  This test is not yet approved or  cleared by the [place] FDA and has been authorized for detection and/or diagnosis of SARS-CoV-2 by FDA under an Emergency Use Authorization (EUA).  This EUA will remain in effect (meaning this test can be used) for the duration of the COVID-19 declaration under Section 564(b)(1) of the Act, 21 U.S.C. section 360bbb-3(b)(1), unless the authorization is terminated or revoked sooner.  Performed at [facility], [address] [place], [place] [address]          Radiology Studies: CT Head Wo Contrast  Result Date: 06/23/2020 CLINICAL DATA:  Acute left-sided leg cramping. EXAM: CT HEAD WITHOUT CONTRAST TECHNIQUE: Contiguous axial images were obtained from the base of the skull through the vertex without intravenous contrast. COMPARISON:  None. FINDINGS: Brain: No evidence of acute infarction, hemorrhage, hydrocephalus, extra-axial collection or mass lesion/mass effect. Vascular: No hyperdense vessel or unexpected calcification. Skull: Normal. Negative for fracture or focal lesion. Sinuses/Orbits: No acute finding. Other:  None. IMPRESSION: No acute intracranial pathology. Electronically Signed   By: Virgina Norfolk M.D.   On: 06/23/2020 03:32   MR BRAIN WO CONTRAST  Result Date: 06/23/2020 CLINICAL DATA:  Neuro deficit. Leg cramps and tingling which have resolved. Hypertension. EXAM: MRI HEAD WITHOUT CONTRAST TECHNIQUE: Multiplanar, multiecho pulse sequences of the brain and surrounding structures were obtained without intravenous contrast. COMPARISON:  Head CT from earlier today FINDINGS: Brain: No acute infarction, hemorrhage, hydrocephalus, extra-axial collection or mass lesion. Trace periventricular FLAIR hyperintensity. Normal brain volume. Vascular:  Normal flow voids. Skull and upper cervical spine: Normal marrow signal Sinuses/Orbits: Negative IMPRESSION: Negative study.  No infarct or other explanation for leg symptoms. Electronically Signed   By: Monte Fantasia M.D.   On: 06/23/2020 06:50   EEG adult  Result Date: 06/23/2020 Lora Havens, MD     06/23/2020 10:17 AM Patient Name: Wendy Craig MRN: [id number] Epilepsy Attending: Lora Havens Referring Physician/Provider: Dr Karmen Bongo Date: 06/23/2020 Duration: 24.32 mins Patient history: 49 y.o. female with medical history significant of post-ablative hypothyroidism (Graves disease s/p RAI in 2010); HTN; and DM presenting with AMS. Last night about 1230 she started having LLE cramps.  Her toes were curling up and her calf muscle was jerking.  It resolved with walking around on it.  She went back to bed and it came back, worse - her entire left side was stiff, she was stiff all over, foaming at the mouth, eyes fixed on the ceiling, unresponsive.  She was alert but unable to talk.  It lasted maybe 10 minutes total.  +tongue biting.  No urinary or fecal incontinence.  She felt "foggy" at first after it resolved. EEG to evaluate for seizure. Level of alertness: Awake, asleep AEDs during EEG study: None Technical aspects: This EEG study was done with scalp  electrodes positioned according to the 10-20 International system of electrode placement. Electrical activity was acquired at a sampling rate of '500Hz'  and reviewed with a high frequency filter of '70Hz'  and a low frequency filter of '1Hz' . EEG data were recorded continuously and digitally stored. Description: The posterior dominant rhythm consists of 10 Hz activity of moderate voltage (25-35 uV) seen predominantly in posterior head regions, symmetric and reactive to eye opening and eye closing. Sleep was characterized by vertex waves, sleep spindles (12 to 14 Hz), maximal frontocentral region.   Physiologic photic driving was seen during photic stimulation.  Hyperventilation was not performed.   IMPRESSION: This study is within normal limits. No seizures or epileptiform discharges were seen throughout the recording. Priyanka Barbra Sarks        Scheduled Meds: . docusate sodium  100 mg Oral BID  . enoxaparin (LOVENOX) injection  40 mg Subcutaneous Daily  . lisinopril  20 mg Oral Daily   And  . hydrochlorothiazide  12.5 mg Oral Daily  . insulin aspart  0-15 Units Subcutaneous TID WC  . insulin aspart  0-5 Units Subcutaneous QHS  . insulin aspart  4 Units Subcutaneous TID WC  . insulin glargine  20 Units Subcutaneous Daily  . levothyroxine  112 mcg Oral Daily   Continuous Infusions: . sodium chloride 75 mL/hr (06/24/20 1224)     LOS: 0 days    Time spent: 37 minutes spent on chart review, discussion with nursing staff, consultants, updating family and interview/physical exam; more than 50% of that time was spent in counseling and/or coordination of care.    Radek Carnero J [place] DO [facility] Available via Epic secure chat 7am-7pm After these hours, please refer to coverage provider listed on [email or web address] 06/24/2020, 3:51 PM

## 2020-06-24 NOTE — Progress Notes (Signed)
Inpatient Diabetes Program Recommendations  AACE/ADA: New Consensus Statement on Inpatient Glycemic Control (2015)  Target Ranges:  Prepandial:   less than 140 mg/dL      Peak postprandial:   less than 180 mg/dL (1-2 hours)      Critically ill patients:  140 - 180 mg/dL   Lab Results  Component Value Date   GLUCAP 92 06/24/2020   HGBA1C 12.8 (H) 06/23/2020    Review of Glycemic Control Results for Wendy Craig, Wendy Craig (MRN [id number]) as of 06/24/2020 15:31  Ref. Range 06/23/2020 16:16 06/23/2020 21:27 06/24/2020 06:09 06/24/2020 11:05  Glucose-Capillary Latest Ref Range: 70 - 99 mg/dL 151 (H) 125 (H) 200 (H) 92   Diabetes history: Type 2 DM Outpatient Diabetes medications: Jentadueto 02-999 mg QD Current orders for Inpatient glycemic control: Lantus 20 units QD, Novolog 4 units tID, Novolog 0-125 units TID, Novolog 0-5 units QHS  Inpatient Diabetes Program Recommendations:    Spoke with patient at length regarding outpatient diabetes management. Patient admitted not taking oral medications consistently and usually only take close to 50% of time.  Reviewed patient's current A1c of 12.8%. Explained what a A1c is and what it measures. Also reviewed goal A1c with patient, importance of good glucose control @ home, and blood sugar goals. Reviewed patho of DM, need for insulin, role of pancreas, signs and symptoms of hypo vs hyper glycemia, interventions, differences between long acting vs short acting insulin, sliding scale, when to call MD, vascular impacts and other long term commorbidities.  Patient has a meter and supplies at home. Was not using prior to admission. Encouraged and reviewed recommended frequency and when to call Md. Additionally, reviewed [employer] application, use, benefits, and risks. Patient is interested. Given order, appliled at 1530.  Educated patient and spouse on insulin pen use at home. Reviewed contents of insulin flexpen starter kit. Reviewed all steps if insulin  pen including attachment of needle, 2-unit air shot, dialing up dose, giving injection, removing needle, disposal of sharps, storage of unused insulin, disposal of insulin etc. Patient able to provide successful return demonstration. Also reviewed troubleshooting with insulin pen. MD to give patient Rxs for insulin pens and insulin pen needles.  TOC consult placed as patient is new to insulin and will need to be seen at PCP sooner than mid-November and to perform benefits check on outpatient insulin costs for Levemir vs. Lantus and Novolog vs Humalog insulin pens.  [facility] endocrinology and waiting to hear approval for new patient for [facility]. Awaiting official appointment.  Discussed need for insulin at discharge with Dr [place] Order received for [employer].   Patient admits to drinking sugary beverages. Reviewed alteratives, plate method, foods higher in CHO and encouraged mindfulness. Dietitian consult placed as well as outpatient education.  Patient has no further questions at this time.   Thanks, Bronson Curb, MSN, RNC-OB Diabetes Coordinator [phone] (8a-5p)

## 2020-06-25 DIAGNOSIS — R4189 Other symptoms and signs involving cognitive functions and awareness: Secondary | ICD-10-CM | POA: Diagnosis not present

## 2020-06-25 LAB — GLUCOSE, CAPILLARY
Glucose-Capillary: 131 mg/dL — ABNORMAL HIGH (ref 70–99)
Glucose-Capillary: 96 mg/dL (ref 70–99)

## 2020-06-25 LAB — BASIC METABOLIC PANEL
Anion gap: 2 — ABNORMAL LOW (ref 5–15)
BUN: 6 mg/dL (ref 6–20)
CO2: 24 mmol/L (ref 22–32)
Calcium: 8.3 mg/dL — ABNORMAL LOW (ref 8.9–10.3)
Chloride: 108 mmol/L (ref 98–111)
Creatinine, Ser: 0.67 mg/dL (ref 0.44–1.00)
GFR calc Af Amer: >60 mL/min (ref >60)
GFR calc non Af Amer: >60 mL/min (ref >60)
Glucose, Bld: 123 mg/dL — ABNORMAL HIGH (ref 70–99)
Potassium: 3.1 mmol/L — ABNORMAL LOW (ref 3.5–5.1)
Sodium: 134 mmol/L — ABNORMAL LOW (ref 135–145)

## 2020-06-25 MED ORDER — INSULIN GLARGINE 100 UNIT/ML SOLOSTAR PEN: 20.0000 [IU] | PEN_INJECTOR | Freq: Every day | SUBCUTANEOUS | 0 refills | Status: DC

## 2020-06-25 MED ORDER — POTASSIUM CHLORIDE CRYS ER 20 MEQ PO TBCR
40.0000 meq | EXTENDED_RELEASE_TABLET | ORAL | Status: DC
  Administered 2020-06-25: 40 meq via ORAL
  Filled 2020-06-25: qty 2

## 2020-06-25 MED ORDER — METFORMIN HCL 1000 MG PO TABS: 1000.0000 mg | ORAL_TABLET | Freq: Two times a day (BID) | ORAL | 0 refills | Status: DC

## 2020-06-25 MED ORDER — LEVOTHYROXINE SODIUM 112 MCG PO TABS: 112.0000 ug | ORAL_TABLET | Freq: Every day | ORAL | 0 refills | Status: DC

## 2020-06-25 MED ORDER — "PEN NEEDLES 3/16"" 31G X 5 MM MISC": 0 refills | Status: DC

## 2020-06-25 MED ORDER — INSULIN LISPRO 200 UNIT/ML ~~LOC~~ SOPN: 2.0000 [IU] | PEN_INJECTOR | Freq: Three times a day (TID) | SUBCUTANEOUS | 0 refills | Status: DC

## 2020-06-25 NOTE — Progress Notes (Signed)
Pt alert and oriented x4. Pt ambulatory at discharge. Pt had no complaints. Pt verbalized understanding of discharge instructions. Vitals WDL upon departure. 

## 2020-06-25 NOTE — Discharge Instructions (Signed)
Daily Diabetes Record Check your blood glucose (BG) as directed by your health care provider. Use this form to record your BG results as well as any diabetes medicines that you take, including insulin. Bringing a record of your BG results and a list of your current medicines to your health care provider is very helpful in managing your diabetes. These numbers help your health care provider to know whether your diabetes management plan needs to be changed. Patient name: ____________________________________ Week of ____________________ Daily BG results and diabetes medicines Date: _________  Breakfast - BG / Medicines: ________________ / __________________________________________________________  Lunch - BG / Medicines: ___________________ / __________________________________________________________  Dinner - BG / Medicines: __________________ / __________________________________________________________  Bedtime - BG / Medicines: ________________ / ___________________________________________________________ Date: _________  Breakfast - BG / Medicines: ________________ / __________________________________________________________  Lunch - BG / Medicines: ___________________ / __________________________________________________________  Dinner - BG / Medicines: __________________ / __________________________________________________________  Bedtime - BG / Medicines: ________________ / ___________________________________________________________ Date: _________  Breakfast - BG / Medicines: ________________ / __________________________________________________________  Lunch - BG / Medicines: ___________________ / __________________________________________________________  Dinner - BG / Medicines: __________________ / __________________________________________________________  Bedtime - BG / Medicines: ________________ / ___________________________________________________________ Date:  _________  Breakfast - BG / Medicines: ________________ / __________________________________________________________  Lunch - BG / Medicines: ___________________ / __________________________________________________________  Dinner - BG / Medicines: __________________ / __________________________________________________________  Bedtime - BG / Medicines: ________________ / ___________________________________________________________ Date: _________  Breakfast - BG / Medicines: ________________ / __________________________________________________________  Lunch - BG / Medicines: ___________________ / __________________________________________________________  Dinner - BG / Medicines: __________________ / __________________________________________________________  Bedtime - BG / Medicines: ________________ / ___________________________________________________________ Date: _________  Breakfast - BG / Medicines: ________________ / __________________________________________________________  Lunch - BG / Medicines: ___________________ / __________________________________________________________  Dinner - BG / Medicines: __________________ / __________________________________________________________  Bedtime - BG / Medicines: ________________ / ___________________________________________________________ Date: _________  Breakfast - BG / Medicines: ________________ / __________________________________________________________  Lunch - BG / Medicines: ___________________ / __________________________________________________________  Dinner - BG / Medicines: __________________ / __________________________________________________________  Bedtime - BG / Medicines: ________________ / ___________________________________________________________ Notes: ______________________________________________________________________________________________________________________ This information is not  intended to replace advice given to you by your health care provider. Make sure you discuss any questions you have with your health care provider. Document Revised: 07/09/2018 Document Reviewed: 06/23/2016 Elsevier Patient Education  2020 [employer] Diabetes Mellitus and Nutrition, Adult When you have diabetes (diabetes mellitus), it is very important to have healthy eating habits because your blood sugar (glucose) levels are greatly affected by what you eat and drink. Eating healthy foods in the appropriate amounts, at about the same times every day, can help you:  Control your blood glucose.  Lower your risk of heart disease.  Improve your blood pressure.  Reach or maintain a healthy weight. Every person with diabetes is different, and each person has different needs for a meal plan. Your health care provider may recommend that you work with a diet and nutrition specialist (dietitian) to make a meal plan that is best for you. Your meal plan may vary depending on factors such as:  The calories you need.  The medicines you take.  Your weight.  Your blood glucose, blood pressure, and cholesterol levels.  Your activity level.  Other health conditions you have, such as heart or kidney disease. How do carbohydrates affect me? Carbohydrates, also called carbs, affect your blood glucose level more than any other type of food. Eating carbs naturally raises the amount of glucose in your blood.  Carb counting is a method for keeping track of how many carbs you eat. Counting carbs is important to keep your blood glucose at a healthy level, especially if you use insulin or take certain oral diabetes medicines. It is important to know how many carbs you can safely have in each meal. This is different for every person. Your dietitian can help you calculate how many carbs you should have at each meal and for each snack. Foods that contain carbs include:  Bread, cereal, rice, pasta, and  crackers.  Potatoes and corn.  Peas, beans, and lentils.  Milk and yogurt.  Fruit and juice.  Desserts, such as cakes, cookies, ice cream, and candy. How does alcohol affect me? Alcohol can cause a sudden decrease in blood glucose (hypoglycemia), especially if you use insulin or take certain oral diabetes medicines. Hypoglycemia can be a life-threatening condition. Symptoms of hypoglycemia (sleepiness, dizziness, and confusion) are similar to symptoms of having too much alcohol. If your health care provider says that alcohol is safe for you, follow these guidelines:  Limit alcohol intake to no more than 1 drink per day for nonpregnant women and 2 drinks per day for men. One drink equals 12 oz of beer, 5 oz of wine, or 1 oz of hard liquor.  Do not drink on an empty stomach.  Keep yourself hydrated with water, diet soda, or unsweetened iced tea.  Keep in mind that regular soda, juice, and other mixers may contain a lot of sugar and must be counted as carbs. What are tips for following this plan?  Reading food labels  Start by checking the serving size on the "Nutrition Facts" label of packaged foods and drinks. The amount of calories, carbs, fats, and other nutrients listed on the label is based on one serving of the item. Many items contain more than one serving per package.  Check the total grams (g) of carbs in one serving. You can calculate the number of servings of carbs in one serving by dividing the total carbs by 15. For example, if a food has 30 g of total carbs, it would be equal to 2 servings of carbs.  Check the number of grams (g) of saturated and trans fats in one serving. Choose foods that have low or no amount of these fats.  Check the number of milligrams (mg) of salt (sodium) in one serving. Most people should limit total sodium intake to less than 2,300 mg per day.  Always check the nutrition information of foods labeled as "low-fat" or "nonfat". These foods may be  higher in added sugar or refined carbs and should be avoided.  Talk to your dietitian to identify your daily goals for nutrients listed on the label. Shopping  Avoid buying canned, premade, or processed foods. These foods tend to be high in fat, sodium, and added sugar.  Shop around the outside edge of the grocery store. This includes fresh fruits and vegetables, bulk grains, fresh meats, and fresh dairy. Cooking  Use low-heat cooking methods, such as baking, instead of high-heat cooking methods like deep frying.  Cook using healthy oils, such as olive, canola, or sunflower oil.  Avoid cooking with butter, cream, or high-fat meats. Meal planning  Eat meals and snacks regularly, preferably at the same times every day. Avoid going long periods of time without eating.  Eat foods high in fiber, such as fresh fruits, vegetables, beans, and whole grains. Talk to your dietitian about how many servings of carbs you  can eat at each meal.  Eat 4-6 ounces (oz) of lean protein each day, such as lean meat, chicken, fish, eggs, or tofu. One oz of lean protein is equal to: ? 1 oz of meat, chicken, or fish. ? 1 egg. ?  cup of tofu.  Eat some foods each day that contain healthy fats, such as avocado, nuts, seeds, and fish. Lifestyle  Check your blood glucose regularly.  Exercise regularly as told by your health care provider. This may include: ? 150 minutes of moderate-intensity or vigorous-intensity exercise each week. This could be brisk walking, biking, or water aerobics. ? Stretching and doing strength exercises, such as yoga or weightlifting, at least 2 times a week.  Take medicines as told by your health care provider.  Do not use any products that contain nicotine or tobacco, such as cigarettes and e-cigarettes. If you need help quitting, ask your health care provider.  Work with a Veterinary surgeon or diabetes educator to identify strategies to manage stress and any emotional and social  challenges. Questions to ask a health care provider  Do I need to meet with a diabetes educator?  Do I need to meet with a dietitian?  What number can I call if I have questions?  When are the best times to check my blood glucose? Where to find more information:  American Diabetes Association: diabetes.org  Academy of Nutrition and Dietetics: www.eatright.[employer] of Diabetes and Digestive and Kidney Diseases (NIH): [employer] Summary  A healthy meal plan will help you control your blood glucose and maintain a healthy lifestyle.  Working with a diet and nutrition specialist (dietitian) can help you make a meal plan that is best for you.  Keep in mind that carbohydrates (carbs) and alcohol have immediate effects on your blood glucose levels. It is important to count carbs and to use alcohol carefully. This information is not intended to replace advice given to you by your health care provider. Make sure you discuss any questions you have with your health care provider. Document Revised: 09/07/2017 Document Reviewed: 10/30/2016 Elsevier Patient Education  2020 [employer] Inc. Type 2 Diabetes Mellitus, Diagnosis, Adult Type 2 diabetes (type 2 diabetes mellitus) is a long-term (chronic) disease. It may be caused by one or both of these problems:  Your pancreas does not make enough of a hormone called insulin.  Your body does not react in a normal way to insulin that it makes. Insulin lets sugars (glucose) go into cells in your body. This gives you energy. If you have type 2 diabetes, sugars cannot get into cells. This causes high blood sugar (hyperglycemia). Your doctor will set treatment goals for you. Generally, you should have these blood sugar levels:  Before meals (preprandial): 80-130 mg/dL (9.5-0.9 mmol/L).  After meals (postprandial): below 180 mg/dL (10 mmol/L).  A1c (hemoglobin A1c) level: less than 7%. Follow these instructions at home: Questions  to ask your doctor  You may want to ask these questions: ? Do I need to meet with a diabetes educator? ? Where can I find a support group for people with diabetes? ? What equipment will I need to care for myself at home? ? What diabetes medicines do I need? When should I take them? ? How often do I need to check my blood sugar? ? What number can I call if I have questions? ? When is my next doctor's visit? General instructions  Take over-the-counter and prescription medicines only as told by your doctor.  Keep  all follow-up visits as told by your doctor. This is important. Contact a doctor if:  Your blood sugar is at or above 240 mg/dL (25.9 mmol/L) for 2 days in a row.  You have been sick for 2 days or more, and you are not getting better.  You have had a fever for 2 days or more, and you are not getting better.  You have any of these problems for more than 6 hours: ? You cannot eat or drink. ? You feel sick to your stomach (nauseous). ? You throw up (vomit). ? You have watery poop (diarrhea). Get help right away if:  Your blood sugar is lower than 54 mg/dL (3 mmol/L).  You get confused.  You have trouble: ? Thinking clearly. ? Breathing.  You have moderate or large ketone levels in your pee (urine). Summary  Type 2 diabetes is a long-term (chronic) disease. Your pancreas may not make enough of a hormone called insulin, or your body may not react normally to insulin that it makes.  Take over-the-counter and prescription medicines only as told by your doctor.  Keep all follow-up visits as told by your doctor. This is important. This information is not intended to replace advice given to you by your health care provider. Make sure you discuss any questions you have with your health care provider. Document Revised: 11/23/2017 Document Reviewed: 10/29/2015 Elsevier Patient Education  2020 [employer].

## 2020-06-25 NOTE — Discharge Summary (Signed)
Physician Discharge Summary  Wendy Mainlanduwanda G Falk ZOX:[id number] DOB: 1971/10/01 DOA: 06/23/2020  PCP: Patient, No Pcp Per  Admit date: 06/23/2020 Discharge date: 06/25/2020  Admitted From: Home Disposition: Home  Recommendations for Outpatient Follow-up:  1. Follow up with PCP, scheduled with Thad Rangerrystal King, NP on 07/09/2020 at 0920 2. Started on Lantus 20 units subcutaneously daily 3. Continue Metformin 1000 g p.o. twice daily 4. Increase levothyroxine to 112 mcg p.o. daily, will need follow-up TFTs in 4-6 weeks 5. Discharged with libre glucose monitoring unit 6. Recommend outpatient referral to endocrinology for assistance with diabetes and hypothyroidism  Home Health: No Equipment/Devices: None  Discharge Condition: Stable CODE STATUS: Full code Diet recommendation: Heart healthy/consistent carbohydrate diet  History of present illness:  Wendy Craig is a 49 year old female with past medical history notable for post-ablative hypothyroidism (Graves disease s/p RAI in 2010); HTN; and DM presenting with AMS.Last night about 1230 she started having LLE cramps. Her toes were curling up and her calf muscle was jerking. It resolved with walking around on it. She went back to bed and it came back, worse - her entire left side was stiff, she was stiff all over, foaming at the mouth, eyes fixed on the ceiling, unresponsive. She was alert but unable to talk. It lasted maybe 10 minutes total. +tongue biting. No urinary or fecal incontinence. She felt "foggy" at first after it resolved. She was not back to her usual self until they were in the ER, according to her husband. No h/o similar.  In the ED, patient was noted to have a markedly elevated glucose of 621.  CT head without contrast unrevealing.  Concern patient with seizure, and TRH consulted for further evaluation and treatment.  Hospital course:  Acute metabolic encephalopathy: Resolved Patient presenting to the ED with  unresponsive episode with left-sided stiffness and tongue biting.  No previous history of seizure disorder.  CT head with no acute intracranial abnormality.  MR brain with no acute finding.  EEG with no epileptiform or seizure-like activity.  Urinalysis unrevealing.  UDS negative.  TSH slightly elevated 4.969 with a free T4 of 0.90.  Unlikely seizure given negative work-up.  Most likely etiology is severely elevated glucose causing altered sensorium.  Patient's glucose is now markedly improved with insulin and mentation, motor function is at baseline.  Type 2 diabetes mellitus, uncontrolled At home patient linagliptin/Metformin 02-999 mg p.o. daily.  Was noted to have an elevated glucose of 621 on arrival with beta hydroxybutyrate acid 0.16 and normal anion gap.  Hemoglobin A1c 12.8 correlating with extremely poor control.  Diabetic educator was consulted and followed during hospital course.  Patient's Lantus was titrated up to 20 unit subcutaneously daily with better glucose control.  Patient will discharge with Humalog insulin sliding scale for additional coverage if necessary.  Patient also was given libre lifestyle glucose monitor on discharge.  Patient has follow-up scheduled at patient care center with Thad Rangerrystal King on 08/09/2020 at 9:20 AM and Dr. Kateri PlummerMorrow on 08/23/2020 at 10:10 AM. Pending endocrinology referral with Dr. Lonzo CloudShamleffer, pending appointment.   Hypothyroidism TSH slightly elevated 4.969 with a free T4 of 0.90.  On levothyroxine 100 mcg p.o. daily at home. Increased levothyroxine to 112 mcg p.o. daily. Will need repeat TFTs 4-6 weeks. Hope to get outpatient follow-up with endocrinology as above  Essential hypertension Continue lisinopril 20 mg p.o. daily, HCTZ 12.5 mg p.o. daily  Discharge Diagnoses:  Active Problems:   Type 2 diabetes mellitus, uncontrolled (HCC)   Hypothyroidism  HTN (hypertension)    Discharge Instructions  Discharge Instructions    Ambulatory referral to  Nutrition and Diabetic Education   Complete by: As directed    Call MD for:  difficulty breathing, headache or visual disturbances   Complete by: As directed    Call MD for:  extreme fatigue   Complete by: As directed    Call MD for:  persistant dizziness or light-headedness   Complete by: As directed    Call MD for:  persistant nausea and vomiting   Complete by: As directed    Call MD for:  severe uncontrolled pain   Complete by: As directed    Call MD for:  temperature >100.4   Complete by: As directed    Diet - low sodium heart healthy   Complete by: As directed    Increase activity slowly   Complete by: As directed      Allergies as of 06/25/2020      Reactions   Sulfa Antibiotics Hives, Swelling      Medication List    STOP taking these medications   Jentadueto XR 02-999 MG Tb24 Generic drug: linaGLIPtin-metFORMIN HCl ER     TAKE these medications   insulin glargine 100 UNIT/ML Solostar Pen Commonly known as: LANTUS Inject 20 Units into the skin daily.   insulin lispro 200 UNIT/ML KwikPen Commonly known as: HUMALOG Inject 2-10 Units into the skin 4 (four) times daily -  before meals and at bedtime. 2 units for glucose 151-200, 4 units for glucose 201-250, 6 units for glucose 251-300, 8 units for glucose 301-350, 10 units for glucose 351-400.   levothyroxine 112 MCG tablet Commonly known as: SYNTHROID Take 1 tablet (112 mcg total) by mouth daily. Start taking on: June 26, 2020 What changed:   medication strength  how much to take   lisinopril-hydrochlorothiazide 20-12.5 MG tablet Commonly known as: ZESTORETIC Take 0.5 tablets by mouth daily.   metFORMIN 1000 MG tablet Commonly known as: Glucophage Take 1 tablet (1,000 mg total) by mouth 2 (two) times daily with a meal.   Pen Needles 3/16" 31G X 5 MM Misc Use as directed with insulin pen       Follow-up Information    Barbette Merino, NP Follow up.   Specialty: Adult Health Nurse  Practitioner Why: July 09, 2020 at 0920 am  Contact information: [address] [place] [address] [phone]              Allergies  Allergen Reactions  . Sulfa Antibiotics Hives and Swelling    Consultations:  None   Procedures/Studies: CT Head Wo Contrast  Result Date: 06/23/2020 CLINICAL DATA:  Acute left-sided leg cramping. EXAM: CT HEAD WITHOUT CONTRAST TECHNIQUE: Contiguous axial images were obtained from the base of the skull through the vertex without intravenous contrast. COMPARISON:  None. FINDINGS: Brain: No evidence of acute infarction, hemorrhage, hydrocephalus, extra-axial collection or mass lesion/mass effect. Vascular: No hyperdense vessel or unexpected calcification. Skull: Normal. Negative for fracture or focal lesion. Sinuses/Orbits: No acute finding. Other: None. IMPRESSION: No acute intracranial pathology. Electronically Signed   By: Aram Candela M.D.   On: 06/23/2020 03:32   MR BRAIN WO CONTRAST  Result Date: 06/23/2020 CLINICAL DATA:  Neuro deficit. Leg cramps and tingling which have resolved. Hypertension. EXAM: MRI HEAD WITHOUT CONTRAST TECHNIQUE: Multiplanar, multiecho pulse sequences of the brain and surrounding structures were obtained without intravenous contrast. COMPARISON:  Head CT from earlier today FINDINGS: Brain: No acute  infarction, hemorrhage, hydrocephalus, extra-axial collection or mass lesion. Trace periventricular FLAIR hyperintensity. Normal brain volume. Vascular: Normal flow voids. Skull and upper cervical spine: Normal marrow signal Sinuses/Orbits: Negative IMPRESSION: Negative study.  No infarct or other explanation for leg symptoms. Electronically Signed   By: Marnee Spring M.D.   On: 06/23/2020 06:50   EEG adult  Result Date: 06/23/2020 Charlsie Quest, MD     06/23/2020 10:17 AM Patient Name: WHITNEE ORZEL MRN: [id number] Epilepsy Attending: Charlsie Quest Referring Physician/Provider: Dr Jonah Blue Date:  06/23/2020 Duration: 24.32 mins Patient history: 49 y.o. female with medical history significant of post-ablative hypothyroidism (Graves disease s/p RAI in 2010); HTN; and DM presenting with AMS. Last night about 1230 she started having LLE cramps.  Her toes were curling up and her calf muscle was jerking.  It resolved with walking around on it.  She went back to bed and it came back, worse - her entire left side was stiff, she was stiff all over, foaming at the mouth, eyes fixed on the ceiling, unresponsive.  She was alert but unable to talk.  It lasted maybe 10 minutes total.  +tongue biting.  No urinary or fecal incontinence.  She felt "foggy" at first after it resolved. EEG to evaluate for seizure. Level of alertness: Awake, asleep AEDs during EEG study: None Technical aspects: This EEG study was done with scalp electrodes positioned according to the 10-20 International system of electrode placement. Electrical activity was acquired at a sampling rate of 500Hz  and reviewed with a high frequency filter of 70Hz  and a low frequency filter of 1Hz . EEG data were recorded continuously and digitally stored. Description: The posterior dominant rhythm consists of 10 Hz activity of moderate voltage (25-35 uV) seen predominantly in posterior head regions, symmetric and reactive to eye opening and eye closing. Sleep was characterized by vertex waves, sleep spindles (12 to 14 Hz), maximal frontocentral region.   Physiologic photic driving was seen during photic stimulation.  Hyperventilation was not performed.   IMPRESSION: This study is within normal limits. No seizures or epileptiform discharges were seen throughout the recording. Priyanka      Subjective: Patient seen and examined bedside, resting comfortably.  No complaints this morning.  Ready for discharge home.  Denies headache, no fever/chills/night sweats, no visual changes, no chest pain, no palpitations, no shortness of breath, no abdominal pain, no  weakness, no fatigue, no paresthesias.  No acute events overnight per nursing staff.  Discharge Exam: Vitals:   06/25/20 0003 06/25/20 0624  BP: 127/87 (!) 140/96  Pulse: 73 69  Resp: 18 18  Temp: 97.8 F (36.6 C) 97.8 F (36.6 C)  SpO2: 99% 100%   Vitals:   06/24/20 1139 06/24/20 1640 06/25/20 0003 06/25/20 0624  BP: (!) 148/93 (!) 128/99 127/87 (!) 140/96  Pulse: 78 77 73 69  Resp: 18 18 18 18   Temp: 98.6 F (37 C) 97.6 F (36.4 C) 97.8 F (36.6 C) 97.8 F (36.6 C)  TempSrc: Oral Oral Oral Oral  SpO2: 98% 100% 99% 100%  Weight:      Height:        General: Pt is alert, awake, not in acute distress Cardiovascular: RRR, S1/S2 +, no rubs, no gallops Respiratory: CTA bilaterally, no wheezing, no rhonchi Abdominal: Soft, NT, ND, bowel sounds + Extremities: no edema, no cyanosis    The results of significant diagnostics from this hospitalization (including imaging, microbiology, ancillary and laboratory) are listed below for reference.  Microbiology: Recent Results (from the past 240 hour(s))  SARS Coronavirus 2 by RT PCR (hospital order, performed in [facility] hospital lab) Nasopharyngeal Nasopharyngeal Swab     Status: None   Collection Time: 06/23/20  7:41 AM   Specimen: Nasopharyngeal Swab  Result Value Ref Range Status   SARS Coronavirus 2 NEGATIVE NEGATIVE Final    Comment: (NOTE) SARS-CoV-2 target nucleic acids are NOT DETECTED.  The SARS-CoV-2 RNA is generally detectable in upper and lower respiratory specimens during the acute phase of infection. The lowest concentration of SARS-CoV-2 viral copies this assay can detect is 250 copies / mL. A negative result does not preclude SARS-CoV-2 infection and should not be used as the sole basis for treatment or other patient management decisions.  A negative result may occur with improper specimen collection / handling, submission of specimen other than nasopharyngeal swab, presence of viral mutation(s) within  the areas targeted by this assay, and inadequate number of viral copies (<250 copies / mL). A negative result must be combined with clinical observations, patient history, and epidemiological information.  Fact Sheet for Patients:   BoilerBrush.com.cy  Fact Sheet for Healthcare Providers: https://pope.com/  This test is not yet approved or  cleared by the [place] FDA and has been authorized for detection and/or diagnosis of SARS-CoV-2 by FDA under an Emergency Use Authorization (EUA).  This EUA will remain in effect (meaning this test can be used) for the duration of the COVID-19 declaration under Section 564(b)(1) of the Act, 21 U.S.C. section 360bbb-3(b)(1), unless the authorization is terminated or revoked sooner.  Performed at [facility] Lab, [address], [place], [place] [address]      Labs: BNP (last 3 results) No results for input(s): BNP in the last 8760 hours. Basic Metabolic Panel: Recent Labs  Lab 06/23/20 0306 06/23/20 0310 06/23/20 0318 06/24/20 0946 06/25/20 0543  NA 130* 131* 131* 139 134*  K 4.1 4.0 4.0 3.5 3.1*  CL 94* 95*  --  109 108  CO2 23  --   --  24 24  GLUCOSE 621* 639*  --  112* 123*  BUN 14 13  --  9 6  CREATININE 0.98 0.80  --  0.81 0.67  CALCIUM 9.7  --   --  9.1 8.3*  MG 1.7  --   --   --   --    Liver Function Tests: Recent Labs  Lab 06/23/20 0306  AST 23  ALT 18  ALKPHOS 70  BILITOT 0.6  PROT 7.5  ALBUMIN 4.0   No results for input(s): LIPASE, AMYLASE in the last 168 hours. No results for input(s): AMMONIA in the last 168 hours. CBC: Recent Labs  Lab 06/23/20 0306 06/23/20 0310 06/23/20 0318  WBC 9.1  --   --   NEUTROABS 6.2  --   --   HGB 12.1 12.9 12.9  HCT 35.6* 38.0 38.0  MCV 83.4  --   --   PLT 282  --   --    Cardiac Enzymes: Recent Labs  Lab 06/23/20 0306  CKTOTAL 119   BNP: Invalid input(s): POCBNP CBG: Recent Labs  Lab 06/24/20 1608  06/24/20 1634 06/24/20 2145 06/25/20 0612 06/25/20 0939  GLUCAP 64* 89 211* 131* 96   D-Dimer No results for input(s): DDIMER in the last 72 hours. Hgb A1c Recent Labs    06/23/20 0306  HGBA1C 12.8*   Lipid Profile No results for input(s): CHOL, HDL, LDLCALC, TRIG, CHOLHDL, LDLDIRECT in  the last 72 hours. Thyroid function studies Recent Labs    06/23/20 0306  TSH 4.969*   Anemia work up No results for input(s): VITAMINB12, FOLATE, FERRITIN, TIBC, IRON, RETICCTPCT in the last 72 hours. Urinalysis    Component Value Date/Time   COLORURINE COLORLESS (A) 06/23/2020 0308   APPEARANCEUR CLEAR 06/23/2020 0308   LABSPEC 1.014 06/23/2020 0308   PHURINE 7.0 06/23/2020 0308   GLUCOSEU >=500 (A) 06/23/2020 0308   HGBUR NEGATIVE 06/23/2020 0308   BILIRUBINUR NEGATIVE 06/23/2020 0308   BILIRUBINUR neg 09/16/2013 1733   KETONESUR NEGATIVE 06/23/2020 0308   PROTEINUR NEGATIVE 06/23/2020 0308   UROBILINOGEN 0.2 09/16/2013 1733   NITRITE NEGATIVE 06/23/2020 0308   LEUKOCYTESUR NEGATIVE 06/23/2020 0308   Sepsis Labs Invalid input(s): PROCALCITONIN,  WBC,  LACTICIDVEN Microbiology Recent Results (from the past 240 hour(s))  SARS Coronavirus 2 by RT PCR (hospital order, performed in [facility] hospital lab) Nasopharyngeal Nasopharyngeal Swab     Status: None   Collection Time: 06/23/20  7:41 AM   Specimen: Nasopharyngeal Swab  Result Value Ref Range Status   SARS Coronavirus 2 NEGATIVE NEGATIVE Final    Comment: (NOTE) SARS-CoV-2 target nucleic acids are NOT DETECTED.  The SARS-CoV-2 RNA is generally detectable in upper and lower respiratory specimens during the acute phase of infection. The lowest concentration of SARS-CoV-2 viral copies this assay can detect is 250 copies / mL. A negative result does not preclude SARS-CoV-2 infection and should not be used as the sole basis for treatment or other patient management decisions.  A negative result may occur with improper  specimen collection / handling, submission of specimen other than nasopharyngeal swab, presence of viral mutation(s) within the areas targeted by this assay, and inadequate number of viral copies (<250 copies / mL). A negative result must be combined with clinical observations, patient history, and epidemiological information.  Fact Sheet for Patients:   BoilerBrush.com.cy  Fact Sheet for Healthcare Providers: https://pope.com/  This test is not yet approved or  cleared by the [place] FDA and has been authorized for detection and/or diagnosis of SARS-CoV-2 by FDA under an Emergency Use Authorization (EUA).  This EUA will remain in effect (meaning this test can be used) for the duration of the COVID-19 declaration under Section 564(b)(1) of the Act, 21 U.S.C. section 360bbb-3(b)(1), unless the authorization is terminated or revoked sooner.  Performed at [facility], [address], [place], [place] [address]      Time coordinating discharge: Over 30 minutes  SIGNED:   Alvira Philips [place], DO  [facility] 06/25/2020, 10:29 AM

## 2020-07-09 ENCOUNTER — Other Ambulatory Visit: Payer: Self-pay

## 2020-07-09 ENCOUNTER — Ambulatory Visit (INDEPENDENT_AMBULATORY_CARE_PROVIDER_SITE_OTHER): Payer: 59 | Admitting: Nurse Practitioner

## 2020-07-09 ENCOUNTER — Encounter: Payer: Self-pay | Admitting: Nurse Practitioner

## 2020-07-09 VITALS — BP 154/85 | HR 74 | Temp 97.8°F | Ht 59.0 in | Wt 122.0 lb

## 2020-07-09 DIAGNOSIS — E038 Other specified hypothyroidism: Secondary | ICD-10-CM

## 2020-07-09 DIAGNOSIS — F419 Anxiety disorder, unspecified: Secondary | ICD-10-CM | POA: Diagnosis not present

## 2020-07-09 DIAGNOSIS — E1165 Type 2 diabetes mellitus with hyperglycemia: Secondary | ICD-10-CM | POA: Diagnosis not present

## 2020-07-09 DIAGNOSIS — I10 Essential (primary) hypertension: Secondary | ICD-10-CM | POA: Diagnosis not present

## 2020-07-09 MED ORDER — HYDROXYZINE HCL 10 MG PO TABS: 10.0000 mg | ORAL_TABLET | Freq: Three times a day (TID) | ORAL | 0 refills | Status: DC | PRN

## 2020-07-09 MED ORDER — FREESTYLE LIBRE 2 SENSOR MISC: 1.0000 "application " | 3 refills | Status: DC

## 2020-07-09 MED ORDER — FREESTYLE LIBRE 2 READER DEVI: 1.0000 "application " | 0 refills | Status: DC

## 2020-07-09 NOTE — Progress Notes (Signed)
[facility] Patient [facility] [address] [place], [place]  [address] Phone:  [phone]   Fax:  [phone]    New Patient Office Visit  Subjective:  Patient ID: Wendy Craig, female    DOB: 02/07/1971  Age: 49 y.o. MRN: [id number]  CC:  Chief Complaint  Patient presents with   Establish Care    patient has stopped taking insulin because was dropping very low.  only taking Metformin.   has Freestyle libre wants [place] to apply to arm    HPI Wendy Craig presents to establish care. She  has a past medical history of Diabetes mellitus, HSV infection (          ), Hypertension, and Thyroid disease (Jan 2010).   Diabetes Mellitus Patient presents for follow up of diabetes. She was diagnosed about 10 yrs. Current symptoms include: hyperglycemia, hypoglycemia, visual disturbance, paresthesia of the feet and hands. Symptoms have stabilized. Patient denies foot ulcerations, increased appetite, nausea, polydipsia, polyuria, vomiting and weight loss. Evaluation to date has included: fasting blood sugar, fasting lipid panel, hemoglobin A1C and microalbuminuria.  Home sugars: BGs range between 64 and 234. Current treatment: Discontinued insulin which has been unable to assess effectiveness, Continued metformin which has been unable to assess effectiveness and Continued ACE inhibitor/ARB which has been somewhat effective. Last dilated eye exam: was postponed.   Anxiety Patient is here for evaluation of anxiety.  She has the following anxiety symptoms: Jittery.  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  Onset of symptoms was approximately several years ago.  Symptoms have been gradually worsening since that time. She denies current suicidal and homicidal ideation. Family history significant for no psychiatric illness.Possible organic causes contributing are: none. Risk factors: none Previous treatment includes medication BuSpar.   She complains of the  following medication side effects: did not so well..  She tries alternatives however not always effective.  Hypertension Patient is here for follow-up of elevated blood pressure. She is exercising and is adherent to a low-salt diet.  Cardiac symptoms: none. Patient denies chest pain, exertional chest pressure/discomfort, fatigue, irregular heart beat, lower extremity edema, palpitations and syncope. Cardiovascular risk factors: diabetes mellitus and hypertension. Use of agents associated with hypertension: none. History of target organ damage: none. Past Medical History:  Diagnosis Date   Diabetes mellitus    HSV infection              Infection on buttocks- June 2011   Hypertension    Thyroid disease Jan 2010   Graves disease: S/P I-131 in 2010    Past Surgical History:  Procedure Laterality Date   ABDOMINAL HYSTERECTOMY      Family History  Problem Relation Age of Onset   Diabetes Mother    Seizures Brother     Social History   Socioeconomic History   Marital status: Married    Spouse name: Not on file   Number of children: Not on file   Years of education: Not on file   Highest education level: Not on file  Occupational History   Occupation: foster care supervisor  Tobacco Use   Smoking status: Never Smoker   Smokeless tobacco: Never Used  Substance and Sexual Activity   Alcohol use: Yes    Alcohol/week: 5.0 standard drinks    Types: 5 Glasses of wine per week   Drug use: Not Currently   Sexual activity: Not on file  Other Topics Concern  Not on file  Social History Narrative   Not on file   Social Determinants of Health   Financial Resource Strain:    Difficulty of Paying Living Expenses: Not on file  Food Insecurity:    Worried About Running Out of Food in the Last Year: Not on file   Ran Out of Food in the Last Year: Not on file  Transportation Needs:    Lack of Transportation (Medical): Not on file   Lack of Transportation  (Non-Medical): Not on file  Physical Activity:    Days of Exercise per Week: Not on file   Minutes of Exercise per Session: Not on file  Stress:    Feeling of Stress : Not on file  Social Connections:    Frequency of Communication with Friends and Family: Not on file   Frequency of Social Gatherings with Friends and Family: Not on file   Attends Religious Services: Not on file   Active Member of Clubs or Organizations: Not on file   Attends Banker Meetings: Not on file   Marital Status: Not on file  Intimate Partner Violence:    Fear of Current or Ex-Partner: Not on file   Emotionally Abused: Not on file   Physically Abused: Not on file   Sexually Abused: Not on file    ROS Review of Systems  Constitutional: Negative for activity change, chills, fever and unexpected weight change.  HENT: Negative.   Eyes: Positive for visual disturbance.  Respiratory: Negative.  Negative for shortness of breath.   Cardiovascular: Negative for chest pain, palpitations and leg swelling.  Gastrointestinal: Positive for nausea. Negative for diarrhea and vomiting.  Endocrine: Negative.   Genitourinary: Negative.   Musculoskeletal: Negative.   Skin: Negative.   Allergic/Immunologic: Negative.     Objective:   Today's Vitals: BP (!) 154/85    Pulse 74    Temp 97.8 F (36.6 C) (Temporal)    Ht 4\' 11"  (1.499 m)    Wt 122 lb (55.3 kg)    SpO2 99%    BMI 24.64 kg/m   Physical Exam Constitutional:      General: She is not in acute distress.    Appearance: She is normal weight.  HENT:     Head: Normocephalic and atraumatic.     Nose: Nose normal.     Mouth/Throat:     Mouth: Mucous membranes are moist.  Cardiovascular:     Rate and Rhythm: Normal rate and regular rhythm.     Pulses: Normal pulses.     Heart sounds: Normal heart sounds.  Pulmonary:     Effort: Pulmonary effort is normal.     Breath sounds: Normal breath sounds.  Abdominal:     General: Bowel  sounds are normal.     Palpations: Abdomen is soft.  Musculoskeletal:        General: Normal range of motion.     Cervical back: Normal range of motion.  Skin:    General: Skin is warm and dry.     Capillary Refill: Capillary refill takes less than 2 seconds.  Neurological:     Mental Status: She is alert and oriented to person, place, and time.  Psychiatric:        Mood and Affect: Mood normal.        Behavior: Behavior normal.        Thought Content: Thought content normal.        Judgment: Judgment normal.     Assessment &  Plan:   Problem List Items Addressed This Visit      Cardiovascular and Mediastinum   HTN (hypertension) Encouraged on going compliance with current medication regimen Encouraged home monitoring and recording BP <130/80 Eating a heart-healthy diet with less salt Encouraged regular physical activity       Endocrine   Hypothyroidism   Type 2 diabetes mellitus, uncontrolled (HCC) - Primary Encourage compliance with current treatment regimen   CBG monitoring via freestyle libre  Encourage contacting office if excessive hyperglycemia and or hypoglycemia Lifestyle modification with healthy diet (fewer calories, more high fiber foods, whole grains and non-starchy vegetables, lower fat meat and fish, low-fat diary include healthy oils) regular exercise (physical activity) Opthalmology exam discussed  Regular dental visits encouraged Home BP monitoring also encouraged goal <130/80    Relevant Orders   Lipid panel   Ambulatory referral to Gastroenterology  Anxiety Will trial Atarax 10 mg to 3 times daily.  Follow-up in 1 month    Outpatient Encounter Medications as of 07/09/2020  Medication Sig   insulin glargine (LANTUS) 100 UNIT/ML Solostar Pen Inject 20 Units into the skin daily.   insulin lispro (HUMALOG) 200 UNIT/ML KwikPen Inject 2-10 Units into the skin 4 (four) times daily -  before meals and at bedtime. 2 units for glucose 151-200, 4 units for  glucose 201-250, 6 units for glucose 251-300, 8 units for glucose 301-350, 10 units for glucose 351-400.   Insulin Pen Needle (PEN NEEDLES 3/16") 31G X 5 MM MISC Use as directed with insulin pen   levothyroxine (SYNTHROID) 112 MCG tablet Take 1 tablet (112 mcg total) by mouth daily.   lisinopril-hydrochlorothiazide (PRINZIDE,ZESTORETIC) 20-12.5 MG tablet Take 0.5 tablets by mouth daily.   metFORMIN (GLUCOPHAGE) 1000 MG tablet Take 1 tablet (1,000 mg total) by mouth 2 (two) times daily with a meal.   Continuous Blood Gluc Receiver (FREESTYLE LIBRE 2 READER) DEVI 1 application by Does not apply route every 14 (fourteen) days.   Continuous Blood Gluc Sensor (FREESTYLE LIBRE 2 SENSOR) MISC 1 application by Does not apply route every 14 (fourteen) days.   hydrOXYzine (ATARAX/VISTARIL) 10 MG tablet Take 1 tablet (10 mg total) by mouth 3 (three) times daily as needed for anxiety.   No facility-administered encounter medications on file as of 07/09/2020.    Follow-up: Return in about 4 weeks (around 08/06/2020).   Barbette Merino, NP

## 2020-07-09 NOTE — Patient Instructions (Addendum)
Diabetes Mellitus and Nutrition, Adult When you have diabetes (diabetes mellitus), it is very important to have healthy eating habits because your blood sugar (glucose) levels are greatly affected by what you eat and drink. Eating healthy foods in the appropriate amounts, at about the same times every day, can help you:  Control your blood glucose.  Lower your risk of heart disease.  Improve your blood pressure.  Reach or maintain a healthy weight. Every person with diabetes is different, and each person has different needs for a meal plan. Your health care provider may recommend that you work with a diet and nutrition specialist (dietitian) to make a meal plan that is best for you. Your meal plan may vary depending on factors such as:  The calories you need.  The medicines you take.  Your weight.  Your blood glucose, blood pressure, and cholesterol levels.  Your activity level.  Other health conditions you have, such as heart or kidney disease. How do carbohydrates affect me? Carbohydrates, also called carbs, affect your blood glucose level more than any other type of food. Eating carbs naturally raises the amount of glucose in your blood. Carb counting is a method for keeping track of how many carbs you eat. Counting carbs is important to keep your blood glucose at a healthy level, especially if you use insulin or take certain oral diabetes medicines. It is important to know how many carbs you can safely have in each meal. This is different for every person. Your dietitian can help you calculate how many carbs you should have at each meal and for each snack. Foods that contain carbs include:  Bread, cereal, rice, pasta, and crackers.  Potatoes and corn.  Peas, beans, and lentils.  Milk and yogurt.  Fruit and juice.  Desserts, such as cakes, cookies, ice cream, and candy. How does alcohol affect me? Alcohol can cause a sudden decrease in blood glucose (hypoglycemia),  especially if you use insulin or take certain oral diabetes medicines. Hypoglycemia can be a life-threatening condition. Symptoms of hypoglycemia (sleepiness, dizziness, and confusion) are similar to symptoms of having too much alcohol. If your health care provider says that alcohol is safe for you, follow these guidelines:  Limit alcohol intake to no more than 1 drink per day for nonpregnant women and 2 drinks per day for men. One drink equals 12 oz of beer, 5 oz of wine, or 1 oz of hard liquor.  Do not drink on an empty stomach.  Keep yourself hydrated with water, diet soda, or unsweetened iced tea.  Keep in mind that regular soda, juice, and other mixers may contain a lot of sugar and must be counted as carbs. What are tips for following this plan?  Reading food labels  Start by checking the serving size on the "Nutrition Facts" label of packaged foods and drinks. The amount of calories, carbs, fats, and other nutrients listed on the label is based on one serving of the item. Many items contain more than one serving per package.  Check the total grams (g) of carbs in one serving. You can calculate the number of servings of carbs in one serving by dividing the total carbs by 15. For example, if a food has 30 g of total carbs, it would be equal to 2 servings of carbs.  Check the number of grams (g) of saturated and trans fats in one serving. Choose foods that have low or no amount of these fats.  Check the number of  milligrams (mg) of salt (sodium) in one serving. Most people should limit total sodium intake to less than 2,300 mg per day.  Always check the nutrition information of foods labeled as "low-fat" or "nonfat". These foods may be higher in added sugar or refined carbs and should be avoided.  Talk to your dietitian to identify your daily goals for nutrients listed on the label. Shopping  Avoid buying canned, premade, or processed foods. These foods tend to be high in fat, sodium,  and added sugar.  Shop around the outside edge of the grocery store. This includes fresh fruits and vegetables, bulk grains, fresh meats, and fresh dairy. Cooking  Use low-heat cooking methods, such as baking, instead of high-heat cooking methods like deep frying.  Cook using healthy oils, such as olive, canola, or sunflower oil.  Avoid cooking with butter, cream, or high-fat meats. Meal planning  Eat meals and snacks regularly, preferably at the same times every day. Avoid going long periods of time without eating.  Eat foods high in fiber, such as fresh fruits, vegetables, beans, and whole grains. Talk to your dietitian about how many servings of carbs you can eat at each meal.  Eat 4-6 ounces (oz) of lean protein each day, such as lean meat, chicken, fish, eggs, or tofu. One oz of lean protein is equal to: ? 1 oz of meat, chicken, or fish. ? 1 egg. ?  cup of tofu.  Eat some foods each day that contain healthy fats, such as avocado, nuts, seeds, and fish. Lifestyle  Check your blood glucose regularly.  Exercise regularly as told by your health care provider. This may include: ? 150 minutes of moderate-intensity or vigorous-intensity exercise each week. This could be brisk walking, biking, or water aerobics. ? Stretching and doing strength exercises, such as yoga or weightlifting, at least 2 times a week.  Take medicines as told by your health care provider.  Do not use any products that contain nicotine or tobacco, such as cigarettes and e-cigarettes. If you need help quitting, ask your health care provider.  Work with a Veterinary surgeon or diabetes educator to identify strategies to manage stress and any emotional and social challenges. Questions to ask a health care provider  Do I need to meet with a diabetes educator?  Do I need to meet with a dietitian?  What number can I call if I have questions?  When are the best times to check my blood glucose? Where to find more  information:  American Diabetes Association: diabetes.org  Academy of Nutrition and Dietetics: www.eatright.[employer] of Diabetes and Digestive and Kidney Diseases (NIH): CarFlippers.tn Summary  A healthy meal plan will help you control your blood glucose and maintain a healthy lifestyle.  Working with a diet and nutrition specialist (dietitian) can help you make a meal plan that is best for you.  Keep in mind that carbohydrates (carbs) and alcohol have immediate effects on your blood glucose levels. It is important to count carbs and to use alcohol carefully. This information is not intended to replace advice given to you by your health care provider. Make sure you discuss any questions you have with your health care provider. Document Revised: 09/07/2017 Document Reviewed: 10/30/2016 Elsevier Patient Education  [employer].   The booster is available now. Here are the parameters: Context: The FDA and CDC have not made any decisions about the Moderna or [employer] vaccine boosters. The CDC recommends the third Pfizer dose six months  after receipt of the second dose to the following groups of people:   Americans 65 and older and residents in long-term care settings should receive it.   People aged 50-64 years with underlying medical conditions should receive it.   People aged 18-49 years with underlying medical conditions may receive it, based on individual benefits and risks.   People aged 18-64 years who are at increased risk for increased risk of occupational exposure and transmission, such as health care workers, may receive it, based on individual benefits and risks.  Appointments are required and should be scheduled by visiting [email or web address] or calling [phone] Monday-Friday, 7 a.m.-7 p.m.

## 2020-07-10 LAB — LIPID PANEL
Chol/HDL Ratio: 3.9 ratio (ref 0.0–4.4)
Cholesterol, Total: 224 mg/dL — ABNORMAL HIGH (ref 100–199)
HDL: 57 mg/dL (ref >39)
LDL Chol Calc (NIH): 154 mg/dL — ABNORMAL HIGH (ref 0–99)
Triglycerides: 77 mg/dL (ref 0–149)
VLDL Cholesterol Cal: 13 mg/dL (ref 5–40)

## 2020-07-12 ENCOUNTER — Other Ambulatory Visit: Payer: Self-pay | Admitting: Nurse Practitioner

## 2020-07-19 ENCOUNTER — Other Ambulatory Visit: Payer: Self-pay | Admitting: Nurse Practitioner

## 2020-07-19 ENCOUNTER — Encounter (INDEPENDENT_AMBULATORY_CARE_PROVIDER_SITE_OTHER): Payer: Self-pay

## 2020-07-19 LAB — HM DIABETES EYE EXAM

## 2020-07-19 MED ORDER — ROSUVASTATIN CALCIUM 5 MG PO TABS: 5.0000 mg | ORAL_TABLET | Freq: Every day | ORAL | 3 refills | Status: DC

## 2020-08-04 ENCOUNTER — Encounter (INDEPENDENT_AMBULATORY_CARE_PROVIDER_SITE_OTHER): Payer: 59 | Admitting: Ophthalmology

## 2020-08-09 ENCOUNTER — Other Ambulatory Visit: Payer: Self-pay

## 2020-08-09 ENCOUNTER — Ambulatory Visit (INDEPENDENT_AMBULATORY_CARE_PROVIDER_SITE_OTHER): Payer: 59 | Admitting: Nurse Practitioner

## 2020-08-09 ENCOUNTER — Encounter: Payer: Self-pay | Admitting: Nurse Practitioner

## 2020-08-09 VITALS — BP 157/92 | HR 79 | Temp 98.2°F | Resp 17 | Ht 59.0 in | Wt 120.6 lb

## 2020-08-09 DIAGNOSIS — E1165 Type 2 diabetes mellitus with hyperglycemia: Secondary | ICD-10-CM | POA: Diagnosis not present

## 2020-08-09 DIAGNOSIS — Z1211 Encounter for screening for malignant neoplasm of colon: Secondary | ICD-10-CM | POA: Diagnosis not present

## 2020-08-09 DIAGNOSIS — F419 Anxiety disorder, unspecified: Secondary | ICD-10-CM

## 2020-08-09 DIAGNOSIS — Z1231 Encounter for screening mammogram for malignant neoplasm of breast: Secondary | ICD-10-CM

## 2020-08-09 DIAGNOSIS — I10 Essential (primary) hypertension: Secondary | ICD-10-CM

## 2020-08-09 MED ORDER — LISINOPRIL-HYDROCHLOROTHIAZIDE 20-12.5 MG PO TABS: 1.0000 | ORAL_TABLET | Freq: Every day | ORAL | 3 refills | Status: DC

## 2020-08-09 NOTE — Progress Notes (Signed)
[facility] Patient [facility] [address] [place], [place]  [id number] Phone:  [phone]   Fax:  [phone]    Established Patient Office Visit  Subjective:  Patient ID: Wendy Craig, female    DOB: 03-16-1971  Age: 49 y.o. MRN: [id number]  CC:  Chief Complaint  Patient presents with   Follow-up    follow up for diabetes. Per patient she has no other   Medication Refill    for lisinopril    HPI Wendy Mainlanduwanda G Kinkead presents for follow up. She  has a past medical history of Diabetes mellitus, HSV infection, Hypertension, and Thyroid disease (Jan 2010).   Diabetes Mellitus Patient presents for follow up of diabetes. Current symptoms include: hyperglycemia. Symptoms have stabilized. Patient denies foot ulcerations, hypoglycemia , increased appetite, nausea, paresthesia of the feet, polydipsia, polyuria, visual disturbances, vomiting and weight loss. Evaluation to date has included: fasting blood sugar, fasting lipid panel, hemoglobin A1C and microalbuminuria.  Home sugars: varies and based on what she eats but acceptable range fo the most part. Current treatment: Continued metformin which has been somewhat effective and Continued Jardiance which has been somewhat effective.  Hypertension Patient is here for follow-up of elevated blood pressure. She is exercising and is adherent to a low-salt diet. Blood pressure is not well controlled at home. She has been out of her medication for 2 weeks. Cardiac symptoms: none. Patient denies chest pain, dyspnea, fatigue, irregular heart beat, lower extremity edema, near-syncope, palpitations, paroxysmal nocturnal dyspnea and syncope. Cardiovascular risk factors: diabetes mellitus and hypertension. Use of agents associated with hypertension: none. History of target organ damage: none.  Anxiety Patient is here for evaluation of anxiety.  She has the following anxiety symptoms: with driving and riding . Onset of symptoms was approximately  several months ago.  Symptoms have been gradually worsening since that time. She denies current suicidal and homicidal ideation. Family history significant for no psychiatric illness.Possible organic causes contributing are: none. Risk factors: none Previous treatment includes medication BuSpar and Hydroxyzine.   She complains of the following medication side effects:not effective.    Past Medical History:  Diagnosis Date   Diabetes mellitus    HSV infection              Infection on buttocks- June 2011   Hypertension    Thyroid disease Jan 2010   Graves disease: S/P I-131 in 2010    Past Surgical History:  Procedure Laterality Date   ABDOMINAL HYSTERECTOMY      Family History  Problem Relation Age of Onset   Diabetes Mother    Seizures Brother     Social History   Socioeconomic History   Marital status: Married    Spouse name: Not on file   Number of children: Not on file   Years of education: Not on file   Highest education level: Not on file  Occupational History   Occupation: foster care supervisor  Tobacco Use   Smoking status: Never Smoker   Smokeless tobacco: Never Used  Substance and Sexual Activity   Alcohol use: Yes    Alcohol/week: 5.0 standard drinks    Types: 5 Glasses of wine per week   Drug use: Not Currently   Sexual activity: Not on file  Other Topics Concern   Not on file  Social History Narrative   Not on file   Social Determinants of Health   Financial Resource Strain:    Difficulty of Paying Living Expenses: Not  on file  Food Insecurity:    Worried About Programme researcher, broadcasting/film/video in the Last Year: Not on file   [employer] of Food in the Last Year: Not on file  Transportation Needs:    Lack of Transportation (Medical): Not on file   Lack of Transportation (Non-Medical): Not on file  Physical Activity:    Days of Exercise per Week: Not on file   Minutes of Exercise per Session: Not on file  Stress:    Feeling of  Stress : Not on file  Social Connections:    Frequency of Communication with Friends and Family: Not on file   Frequency of Social Gatherings with Friends and Family: Not on file   Attends Religious Services: Not on file   Active Member of Clubs or Organizations: Not on file   Attends Banker Meetings: Not on file   Marital Status: Not on file  Intimate Partner Violence:    Fear of Current or Ex-Partner: Not on file   Emotionally Abused: Not on file   Physically Abused: Not on file   Sexually Abused: Not on file    Outpatient Medications Prior to Visit  Medication Sig Dispense Refill   empagliflozin (JARDIANCE) 10 MG TABS tablet Jardiance 10 mg tablet  Take 1 tablet by mouth once daily for 30 days     hydrOXYzine (ATARAX/VISTARIL) 10 MG tablet Take 1 tablet (10 mg total) by mouth 3 (three) times daily as needed for anxiety. 30 tablet 0   insulin glargine (LANTUS) 100 UNIT/ML Solostar Pen Inject 20 Units into the skin daily. 18 mL 0   insulin lispro (HUMALOG) 200 UNIT/ML KwikPen Inject 2-10 Units into the skin 4 (four) times daily -  before meals and at bedtime. 2 units for glucose 151-200, 4 units for glucose 201-250, 6 units for glucose 251-300, 8 units for glucose 301-350, 10 units for glucose 351-400. 15 mL 0   Insulin Pen Needle (PEN NEEDLES 3/16") 31G X 5 MM MISC Use as directed with insulin pen 100 each 0   levothyroxine (SYNTHROID) 112 MCG tablet levothyroxine 112 mcg tablet  TAKE 1 TABLET BY MOUTH DAILY at BEDTIME, except on Sunday     metFORMIN (GLUCOPHAGE) 1000 MG tablet Take 1 tablet (1,000 mg total) by mouth 2 (two) times daily with a meal. 180 tablet 0   rosuvastatin (CRESTOR) 5 MG tablet Take 1 tablet (5 mg total) by mouth daily. 90 tablet 3   Continuous Blood Gluc Receiver (FREESTYLE LIBRE 2 READER) DEVI 1 application by Does not apply route every 14 (fourteen) days. 10 each 0   Continuous Blood Gluc Sensor (FREESTYLE LIBRE 2 SENSOR) MISC 1  application by Does not apply route every 14 (fourteen) days. 10 each 3   lisinopril-hydrochlorothiazide (PRINZIDE,ZESTORETIC) 20-12.5 MG tablet Take 0.5 tablets by mouth daily. 45 tablet 1   No facility-administered medications prior to visit.    Allergies  Allergen Reactions   Sulfa Antibiotics Hives and Swelling    ROS Review of Systems    Objective:    Physical Exam Constitutional:      Appearance: She is normal weight.  HENT:     Head: Normocephalic and atraumatic.  Cardiovascular:     Pulses: Normal pulses.  Pulmonary:     Effort: Pulmonary effort is normal.  Skin:    General: Skin is warm and dry.     Capillary Refill: Capillary refill takes less than 2 seconds.  Neurological:     General: No focal  deficit present.     Mental Status: She is alert and oriented to person, place, and time.  Psychiatric:        Mood and Affect: Mood normal.        Behavior: Behavior normal.        Thought Content: Thought content normal.        Judgment: Judgment normal.     BP (!) 157/92    Pulse 79    Temp 98.2 F (36.8 C) (Temporal)    Resp 17    Ht 4\' 11"  (1.499 m)    Wt 120 lb 9.6 oz (54.7 kg)    SpO2 100%    BMI 24.36 kg/m  Wt Readings from Last 3 Encounters:  08/09/20 120 lb 9.6 oz (54.7 kg)  07/09/20 122 lb (55.3 kg)  06/23/20 123 lb 4.8 oz (55.9 kg)     Health Maintenance Due  Topic Date Due   TETANUS/TDAP  Never done   MAMMOGRAM  10/31/2018    There are no preventive care reminders to display for this patient.  Lab Results  Component Value Date   TSH 4.969 (H) 06/23/2020   Lab Results  Component Value Date   WBC 9.1 06/23/2020   HGB 12.9 06/23/2020   HCT 38.0 06/23/2020   MCV 83.4 06/23/2020   PLT 282 06/23/2020   Lab Results  Component Value Date   NA 134 (L) 06/25/2020   K 3.1 (L) 06/25/2020   CO2 24 06/25/2020   GLUCOSE 123 (H) 06/25/2020   BUN 6 06/25/2020   CREATININE 0.67 06/25/2020   BILITOT 0.6 06/23/2020   ALKPHOS 70 06/23/2020     AST 23 06/23/2020   ALT 18 06/23/2020   PROT 7.5 06/23/2020   ALBUMIN 4.0 06/23/2020   CALCIUM 8.3 (L) 06/25/2020   ANIONGAP 2 (L) 06/25/2020   Lab Results  Component Value Date   CHOL 224 (H) 07/09/2020   Lab Results  Component Value Date   HDL 57 07/09/2020   Lab Results  Component Value Date   LDLCALC 154 (H) 07/09/2020   Lab Results  Component Value Date   TRIG 77 07/09/2020   Lab Results  Component Value Date   CHOLHDL 3.9 07/09/2020   Lab Results  Component Value Date   HGBA1C 12.8 (H) 06/23/2020      Assessment & Plan:   Problem List Items Addressed This Visit      Endocrine   Type 2 diabetes mellitus, uncontrolled (HCC) - Primary Encourage compliance with current treatment regimen  Encourage regular CBG monitoring Encourage contacting office if excessive hyperglycemia and or hypoglycemia Lifestyle modification with healthy diet (fewer calories, more high fiber foods, whole grains and non-starchy vegetables, lower fat meat and fish, low-fat diary include healthy oils) regular exercise (physical activity) Home BP monitoring also encouraged goal <130/80     Relevant Medications   lisinopril-hydrochlorothiazide (ZESTORETIC) 20-12.5 MG tablet    Other Visit Diagnoses    Essential hypertension   Encouraged on going compliance with current medication regimen Encouraged home monitoring and recording BP <130/80 Eating a heart-healthy diet with less salt Encouraged regular physical activity     Relevant Medications   lisinopril-hydrochlorothiazide (ZESTORETIC) 20-12.5 MG tablet   Screening mammogram, encounter for       Relevant Orders   MM Digital Diagnostic Bilat   Screening for colon cancer       Relevant Orders   Ambulatory referral to Gastroenterology  Anxiety Information given for [facility] Discontinue the  hydroxyzine if not effective.     Meds ordered this encounter  Medications   lisinopril-hydrochlorothiazide (ZESTORETIC)  20-12.5 MG tablet    Sig: Take 1 tablet by mouth daily.    Dispense:  90 tablet    Refill:  3    PATIENT NEEDS OFFICE VISIT FOR ADDITIONAL REFILLS    Order Specific Question:   Supervising Provider    Answer:   Quentin Angst [[id number]]    Follow-up: Return in about 2 months (around 10/09/2020).    Barbette Merino, NP

## 2020-08-09 NOTE — Patient Instructions (Addendum)
If you have lab work done today you will be contacted with your lab results within the next 2 weeks.  If you have not heard from [place] then please contact us. The fastest way to get your results is to register for My Chart.   IF you received an x-ray today, you will receive an invoice from [facility] Radiology. Please contact [facility] Radiology at [phone] with questions or concerns regarding your invoice.   IF you received labwork today, you will receive an invoice from [facility]. Please contact [facility] at [phone] with questions or concerns regarding your invoice.   Our billing staff will not be able to assist you with questions regarding bills from these companies.  You will be contacted with the lab results as soon as they are available. The fastest way to get your results is to activate your My Chart account. Instructions are located on the last page of this paperwork. If you have not heard from [place] regarding the results in 2 weeks, please contact this office.    [facility] [address] [place], [place] [address]  [phone]  Managing Anxiety, Adult After being diagnosed with an anxiety disorder, you may be relieved to know why you have felt or behaved a certain way. You may also feel overwhelmed about the treatment ahead and what it will mean for your life. With care and support, you can manage this condition and recover from it. How to manage lifestyle changes Managing stress and anxiety  Stress is your body's reaction to life changes and events, both good and bad. Most stress will last just a few hours, but stress can be ongoing and can lead to more than just stress. Although stress can play a major role in anxiety, it is not the same as anxiety. Stress is usually caused by something external, such as a deadline, test, or competition. Stress normally passes after the triggering event has ended.  Anxiety is caused by something internal, such as  imagining a terrible outcome or worrying that something will go wrong that will devastate you. Anxiety often does not go away even after the triggering event is over, and it can become long-term (chronic) worry. It is important to understand the differences between stress and anxiety and to manage your stress effectively so that it does not lead to an anxious response. Talk with your health care provider or a counselor to learn more about reducing anxiety and stress. He or she may suggest tension reduction techniques, such as:  Music therapy. This can include creating or listening to music that you enjoy and that inspires you.  Mindfulness-based meditation. This involves being aware of your normal breaths while not trying to control your breathing. It can be done while sitting or walking.  Centering prayer. This involves focusing on a word, phrase, or sacred image that means something to you and brings you peace.  Deep breathing. To do this, expand your stomach and inhale slowly through your nose. Hold your breath for 3-5 seconds. Then exhale slowly, letting your stomach muscles relax.  Self-talk. This involves identifying thought patterns that lead to anxiety reactions and changing those patterns.  Muscle relaxation. This involves tensing muscles and then relaxing them. Choose a tension reduction technique that suits your lifestyle and personality. These techniques take time and practice. Set aside 5-15 minutes a day to do them. Therapists can offer counseling and training in these techniques. The training to help with anxiety may be covered  by some insurance plans. Other things you can do to manage stress and anxiety include:  Keeping a stress/anxiety diary. This can help you learn what triggers your reaction and then learn ways to manage your response.  Thinking about how you react to certain situations. You may not be able to control everything, but you can control your response.  Making time  for activities that help you relax and not feeling guilty about spending your time in this way.  Visual imagery and yoga can help you stay calm and relax.  Medicines Medicines can help ease symptoms. Medicines for anxiety include:  Anti-anxiety drugs.  Antidepressants. Medicines are often used as a primary treatment for anxiety disorder. Medicines will be prescribed by a health care provider. When used together, medicines, psychotherapy, and tension reduction techniques may be the most effective treatment. Relationships Relationships can play a big part in helping you recover. Try to spend more time connecting with trusted friends and family members. Consider going to couples counseling, taking family education classes, or going to family therapy. Therapy can help you and others better understand your condition. How to recognize changes in your anxiety Everyone responds differently to treatment for anxiety. Recovery from anxiety happens when symptoms decrease and stop interfering with your daily activities at home or work. This may mean that you will start to:  Have better concentration and focus. Worry will interfere less in your daily thinking.  Sleep better.  Be less irritable.  Have more energy.  Have improved memory. It is important to recognize when your condition is getting worse. Contact your health care provider if your symptoms interfere with home or work and you feel like your condition is not improving. Follow these instructions at home: Activity  Exercise. Most adults should do the following: ? Exercise for at least 150 minutes each week. The exercise should increase your heart rate and make you sweat (moderate-intensity exercise). ? Strengthening exercises at least twice a week.  Get the right amount and quality of sleep. Most adults need 7-9 hours of sleep each night. Lifestyle   Eat a healthy diet that includes plenty of vegetables, fruits, whole grains, low-fat  dairy products, and lean protein. Do not eat a lot of foods that are high in solid fats, added sugars, or salt.  Make choices that simplify your life.  Do not use any products that contain nicotine or tobacco, such as cigarettes, e-cigarettes, and chewing tobacco. If you need help quitting, ask your health care provider.  Avoid caffeine, alcohol, and certain over-the-counter cold medicines. These may make you feel worse. Ask your pharmacist which medicines to avoid. General instructions  Take over-the-counter and prescription medicines only as told by your health care provider.  Keep all follow-up visits as told by your health care provider. This is important. Where to find support You can get help and support from these sources:  Self-help groups.  Online and [employer].  A trusted spiritual leader.  Couples counseling.  Family education classes.  Family therapy. Where to find more information You may find that joining a support group helps you deal with your anxiety. The following sources can help you locate counselors or support groups near you:  Mental Health America: www.mentalhealthamerica.net  Anxiety and Depression Association of [place] (ADAA): ProgramCam.de  [employer] on Mental Illness (NAMI): www.nami.org Contact a health care provider if you:  Have a hard time staying focused or finishing daily tasks.  Spend many hours a day feeling worried about everyday  life.  Become exhausted by worry.  Start to have headaches, feel tense, or have nausea.  Urinate more than normal.  Have diarrhea. Get help right away if you have:  A racing heart and shortness of breath.  Thoughts of hurting yourself or others. If you ever feel like you may hurt yourself or others, or have thoughts about taking your own life, get help right away. You can go to your nearest emergency department or call:  Your local emergency services (911 in the [place]  A  suicide crisis helpline, such as the National Suicide Prevention Lifeline at (418)872-5262. This is open 24 hours a day. Summary  Taking steps to learn and use tension reduction techniques can help calm you and help prevent triggering an anxiety reaction.  When used together, medicines, psychotherapy, and tension reduction techniques may be the most effective treatment.  Family, friends, and partners can play a big part in helping you recover from an anxiety disorder. This information is not intended to replace advice given to you by your health care provider. Make sure you discuss any questions you have with your health care provider. Document Revised: 02/25/2019 Document Reviewed: 02/25/2019 Elsevier Patient Education  2020 [employer].

## 2020-08-23 ENCOUNTER — Ambulatory Visit: Payer: 59 | Admitting: Registered Nurse

## 2020-08-25 ENCOUNTER — Other Ambulatory Visit: Payer: Self-pay | Admitting: Nurse Practitioner

## 2020-08-30 NOTE — Telephone Encounter (Signed)
See request. I am not sure if it is duplicate because it is unread, but from 11/17.

## 2020-10-13 ENCOUNTER — Ambulatory Visit: Payer: 59 | Admitting: Nurse Practitioner

## 2020-10-22 ENCOUNTER — Other Ambulatory Visit: Payer: Self-pay

## 2020-10-22 ENCOUNTER — Encounter: Payer: Self-pay | Admitting: Nurse Practitioner

## 2020-10-22 ENCOUNTER — Telehealth (INDEPENDENT_AMBULATORY_CARE_PROVIDER_SITE_OTHER): Payer: 59 | Admitting: Nurse Practitioner

## 2020-10-22 VITALS — Ht 59.0 in | Wt 117.0 lb

## 2020-10-22 DIAGNOSIS — E1165 Type 2 diabetes mellitus with hyperglycemia: Secondary | ICD-10-CM

## 2020-10-22 DIAGNOSIS — E038 Other specified hypothyroidism: Secondary | ICD-10-CM | POA: Diagnosis not present

## 2020-10-22 DIAGNOSIS — F419 Anxiety disorder, unspecified: Secondary | ICD-10-CM | POA: Diagnosis not present

## 2020-10-22 DIAGNOSIS — I10 Essential (primary) hypertension: Secondary | ICD-10-CM

## 2020-10-22 MED ORDER — LEVOTHYROXINE SODIUM 112 MCG PO TABS: ORAL_TABLET | ORAL | 0 refills | Status: DC

## 2020-10-22 NOTE — Progress Notes (Signed)
   [facility] Patient [facility] [address] [place], [place]  [address] Phone:  [phone]   Fax:  [phone] Virtual Visit via Telephone Note  I connected with Wendy Craig on 10/24/20 at 11:00 AM EST by telephone and verified that I am speaking with the correct person using two identifiers.   I discussed the limitations, risks, security and privacy concerns of performing an evaluation and management service by telephone and the availability of in person appointments. I also discussed with the patient that there may be a patient responsible charge related to this service. The patient expressed understanding and agreed to proceed.  Patient home Provider Office  History of Present Illness: Diabetes Mellitus Patient presents for follow up of diabetes. Current symptoms include: weight loss. Symptoms have stabilized. Patient denies foot ulcerations, hyperglycemia, hypoglycemia , increased appetite, nausea, paresthesia of the feet, polydipsia, polyuria and vomiting. Evaluation to date has included: fasting blood sugar, fasting lipid panel, hemoglobin A1C and microalbuminuria.  Home sugars: BGs have been labile ranging between 121 and 150. Current treatment: Continued sulfonylurea which has been effective, Continued statin which has been effective, Continued ACE inhibitor/ARB which has been effective and Continued Januvia which has been effective.   Hypothyroidism She has hypothyroidism. Current symptoms: weight changes She has lost about 3 pounds. Patient denies change in energy level, diarrhea, heat / cold intolerance, nervousness and palpitations. Symptoms have been intermittent. She has been out of her levothyroxine 112 mcg. She has been taking levothyroxine 100 mcg.   She was given a brief letter for accomodation. She admits that she is needing additional paperwork completed.    Observations/Objective:   Telephone visit no exam  Assessment and Plan:  Assessment  Primary  Diagnosis & Pertinent Problem List: The primary encounter diagnosis was Uncontrolled type 2 diabetes mellitus with hyperglycemia (HCC). Diagnoses of Essential hypertension, Other specified hypothyroidism, and Anxiety were also pertinent to this visit.  Visit Diagnosis: 1. Uncontrolled type 2 diabetes mellitus with hyperglycemia (HCC)  Stable per patients CBG. Will come into office in one month for A1C and labs Insulin currently on hold per patients preference  weight loss concerning   2. Essential hypertension  persistent further evaluation and  monitoring at next visit  3. Other specified hypothyroidism  Weight loss labs in 4 weeks or sooner if needed  4. Anxiety  persistent will evaluate counseling  FMLA forms to be completed   Follow Up Instructions:    I discussed the assessment and treatment plan with the patient. The patient was provided an opportunity to ask questions and all were answered. The patient agreed with the plan and demonstrated an understanding of the instructions.   The patient was advised to call back or seek an in-person evaluation if the symptoms worsen or if the condition fails to improve as anticipated.  I provided 12 minutes of non-face-to-face time during this encounter.   Barbette Merino, NP

## 2020-11-04 ENCOUNTER — Encounter: Payer: Self-pay | Admitting: Nurse Practitioner

## 2020-11-04 ENCOUNTER — Telehealth (INDEPENDENT_AMBULATORY_CARE_PROVIDER_SITE_OTHER): Payer: 59 | Admitting: Nurse Practitioner

## 2020-11-04 VITALS — Ht 59.0 in | Wt 115.0 lb

## 2020-11-04 DIAGNOSIS — U071 COVID-19: Secondary | ICD-10-CM | POA: Diagnosis not present

## 2020-11-04 NOTE — Progress Notes (Signed)
   [facility] Patient [facility] [address] [place], [place]  [address] Phone:  [phone]   Fax:  [phone]  Virtual Visit via Telephone Note  I connected with Wendy Craig on 11/04/20 at  3:20 PM EST by telephone and verified that I am speaking with the correct person using two identifiers.   I discussed the limitations, risks, security and privacy concerns of performing an evaluation and management service by telephone and the availability of in person appointments. I also discussed with the patient that there may be a patient responsible charge related to this service. The patient expressed understanding and agreed to proceed.  Patient home Provider Office  History of Present Illness:  She has tested postiive for COVID. She admits that she is not have any fever, chills, shortness of breath or sore throat. She is having some brain fog. She does not feel like herself as it relates to her thoughts. She did return to work however does not feel like her symptoms are improved enough.    Observations/Objective: Patient able to speak in full sentences  No exam virtual visit  Assessment and Plan:  Assessment  Primary Diagnosis & Pertinent Problem List: The encounter diagnosis was COVID-19.  Visit Diagnosis: 1. COVID-19  Encourage symptom relief treatment and rest until Monday to see if this will help with the brain fog.     Follow Up Instructions: Fu as scheduled   I discussed the assessment and treatment plan with the patient. The patient was provided an opportunity to ask questions and all were answered. The patient agreed with the plan and demonstrated an understanding of the instructions.   The patient was advised to call back or seek an in-person evaluation if the symptoms worsen or if the condition fails to improve as anticipated.  I provided 5 minutes of video- face-to-face time during this encounter.   Barbette Merino, NP

## 2020-11-19 ENCOUNTER — Ambulatory Visit: Payer: 59 | Admitting: Nurse Practitioner

## 2021-03-28 ENCOUNTER — Ambulatory Visit
Admission: RE | Admit: 2021-03-28 | Discharge: 2021-03-28 | Disposition: A | Discharge status: Home / Self Care | Payer: 59 | Source: Ambulatory Visit | Attending: Nurse Practitioner | Admitting: Nurse Practitioner

## 2021-03-28 ENCOUNTER — Other Ambulatory Visit: Payer: Self-pay

## 2021-03-28 DIAGNOSIS — Z1231 Encounter for screening mammogram for malignant neoplasm of breast: Secondary | ICD-10-CM

## 2021-05-28 IMAGING — CT CT HEAD W/O CM
3 series · 16 of 47 positions shown, 19 images · non-contrast
Comparison: None.

CLINICAL DATA: Acute left-sided leg cramping.

EXAM:
CT HEAD WITHOUT CONTRAST
TECHNIQUE: Contiguous axial images were obtained from the base of the skull
through the vertex without intravenous contrast.

[Series 3: head 5.0 h30s · axial · 0.41mm/px · z∈[-127,-2]mm · 10 of 31 slices shown, 13 images]
[im 3/31  brain]
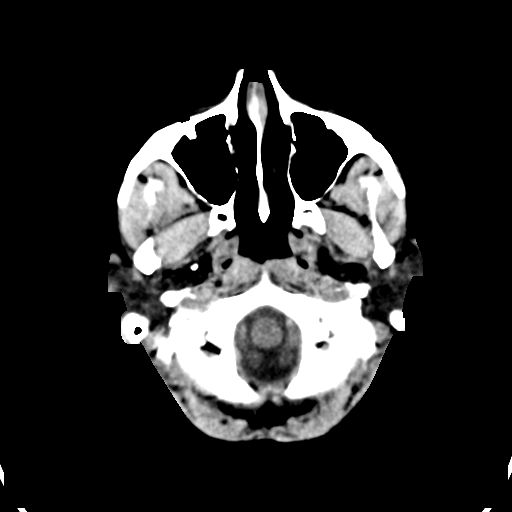
[im 3/31  bone]
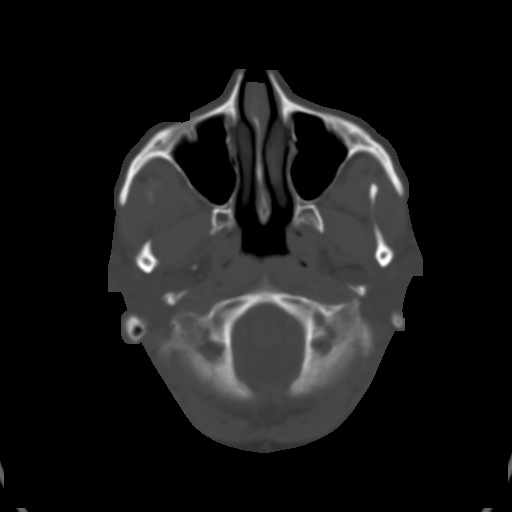
[im 6/31  brain]
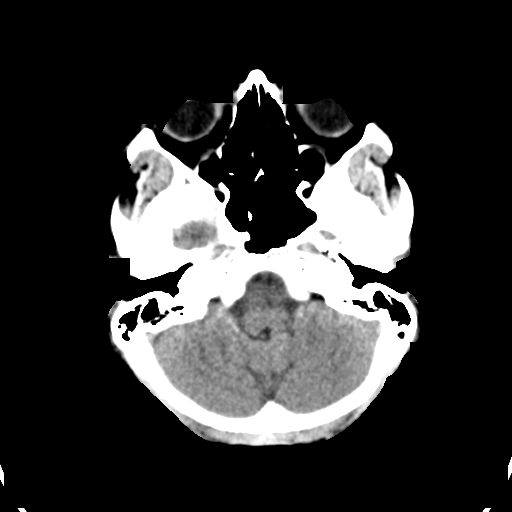
[im 9/31  brain]
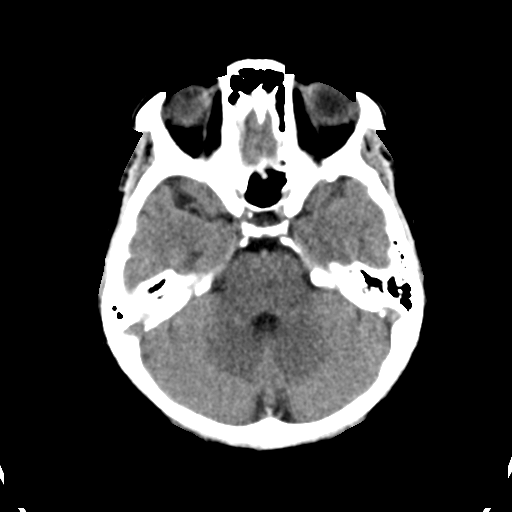
[im 11/31  brain]
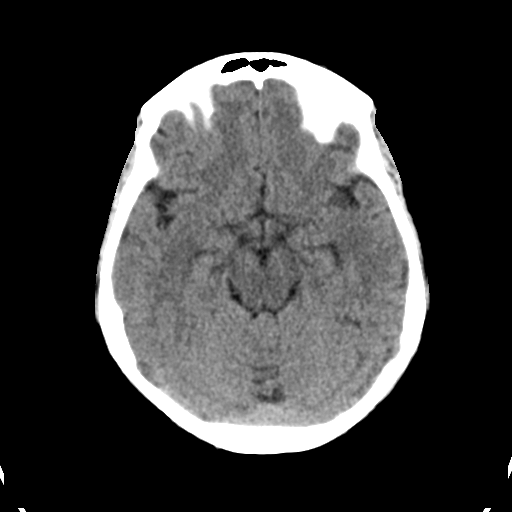
[im 14/31  brain]
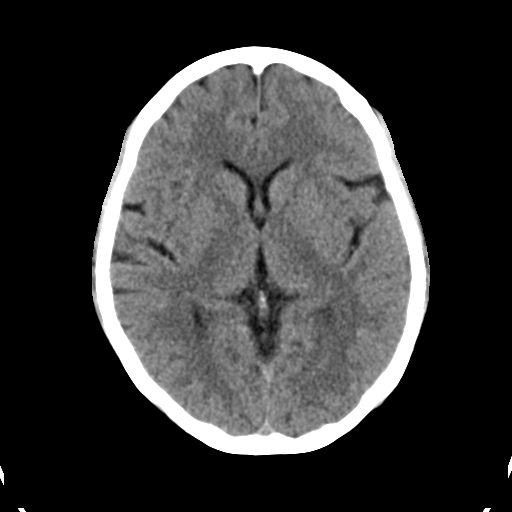
[im 14/31  bone]
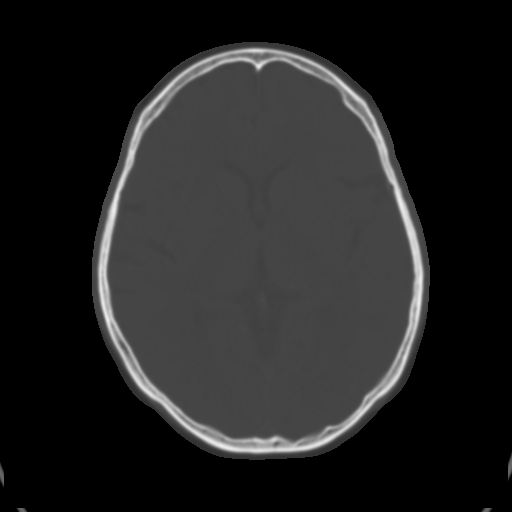
[im 17/31  brain]
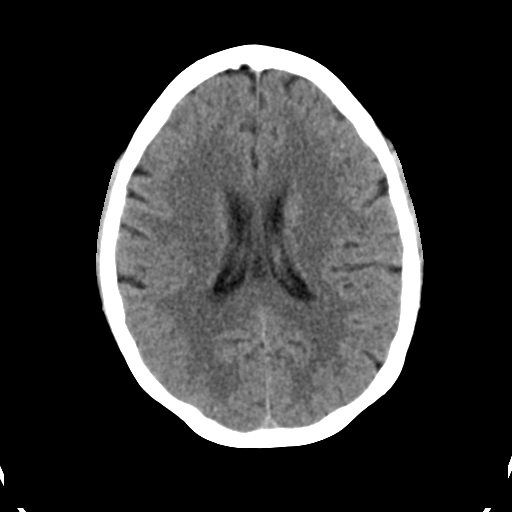
[im 20/31  brain]
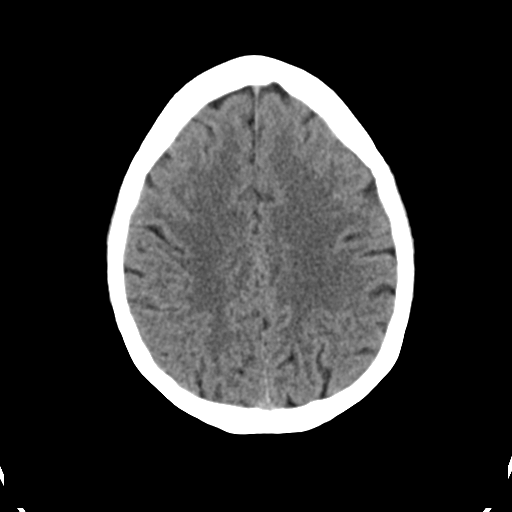
[im 23/31  brain]
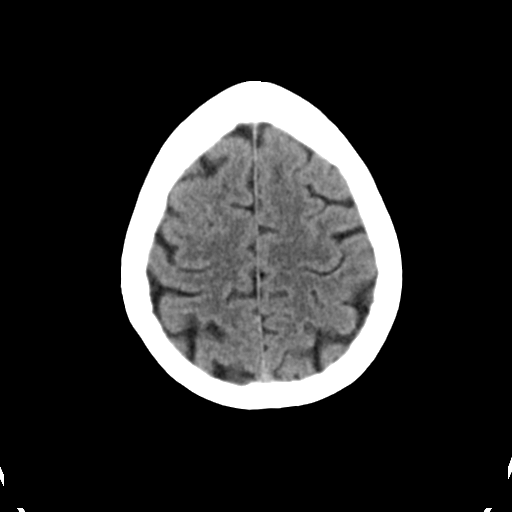
[im 25/31  brain]
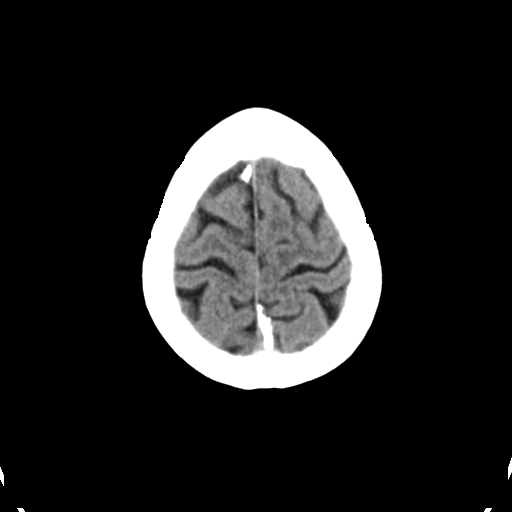
[im 25/31  bone]
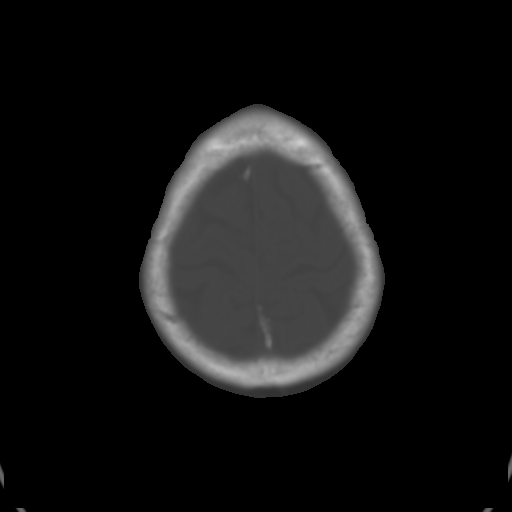
[im 28/31  brain]
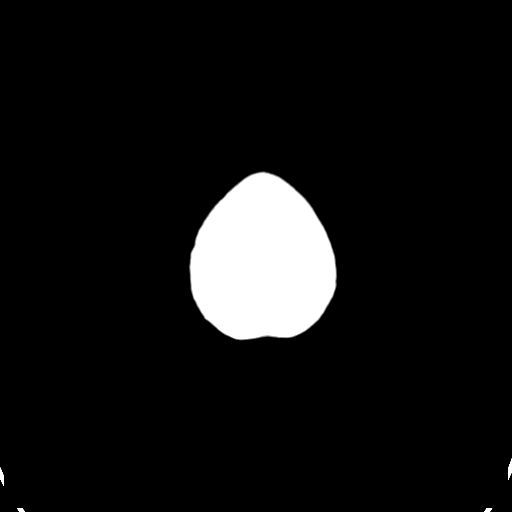

[Series 5: head 3.0 mpr cor · coronal · 0.30mm/px · 3 of 67 slices shown]
[im 23/67  brain]
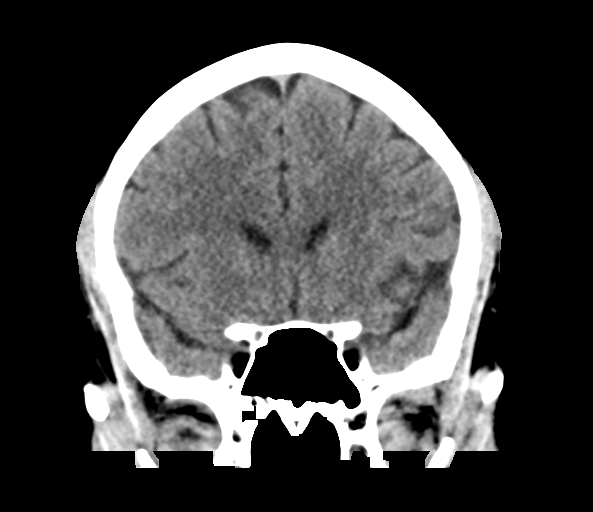
[im 30/67  brain]
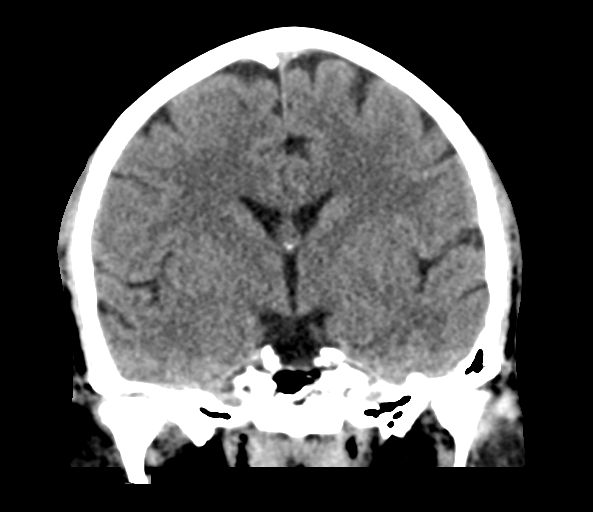
[im 37/67  brain]
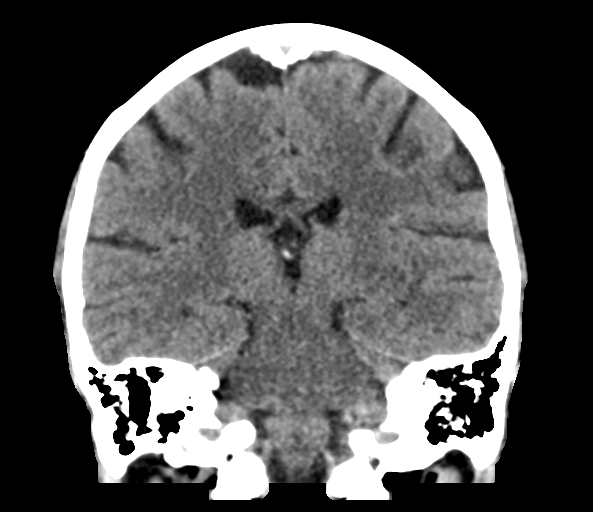

[Series 6: head 3.0 mpr sag · sagittal · 0.33mm/px · 3 of 58 slices shown]
[im 20/58  brain]
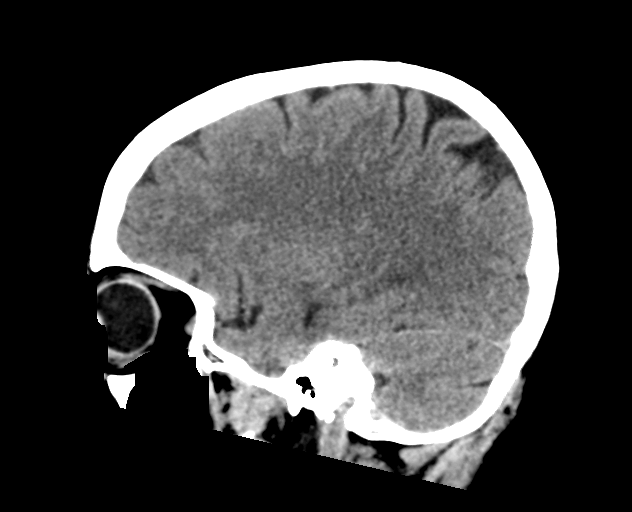
[im 29/58  brain]
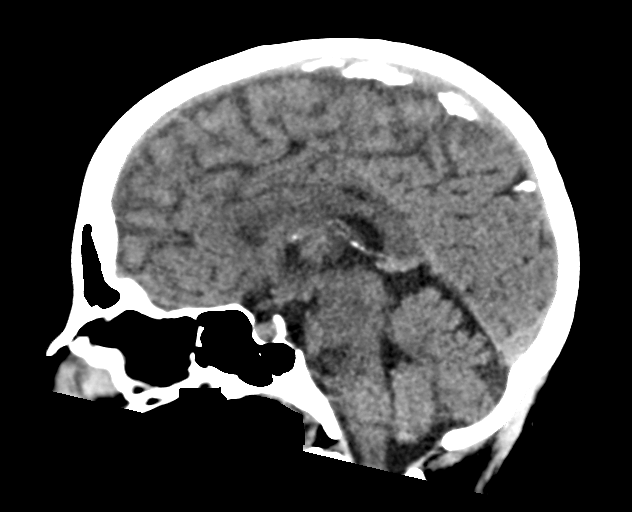
[im 39/58  brain]
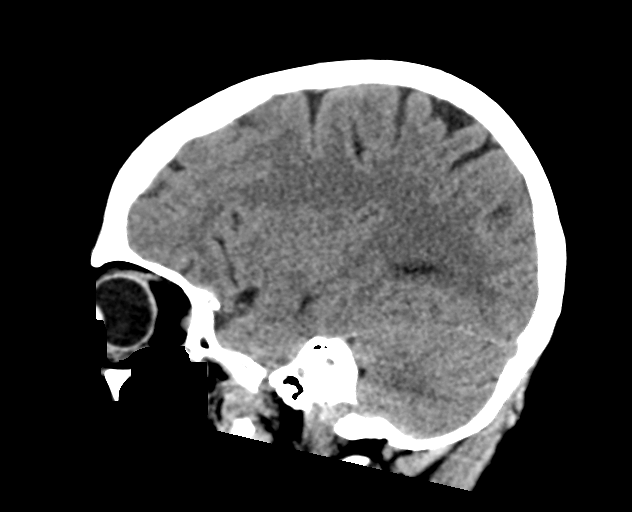

[16 of 47 positions shown; findings below may reference images not displayed]

FINDINGS: Brain: No evidence of acute infarction, hemorrhage, hydrocephalus,
extra-axial collection or mass lesion/mass effect.

Vascular: No hyperdense vessel or unexpected calcification.

Skull: Normal. Negative for fracture or focal lesion.

Sinuses/Orbits: No acute finding.

Other: None.
IMPRESSION: No acute intracranial pathology.

## 2021-12-14 ENCOUNTER — Other Ambulatory Visit: Payer: Self-pay | Admitting: Nurse Practitioner

## 2021-12-21 ENCOUNTER — Other Ambulatory Visit: Payer: Self-pay | Admitting: Nurse Practitioner

## 2021-12-22 ENCOUNTER — Other Ambulatory Visit: Payer: Self-pay

## 2021-12-22 DIAGNOSIS — I10 Essential (primary) hypertension: Secondary | ICD-10-CM

## 2021-12-22 NOTE — Telephone Encounter (Signed)
Metformin ?Hypertension ?Thyroid med ?

## 2021-12-23 ENCOUNTER — Other Ambulatory Visit: Payer: Self-pay | Admitting: Nurse Practitioner

## 2021-12-23 DIAGNOSIS — I10 Essential (primary) hypertension: Secondary | ICD-10-CM

## 2021-12-23 MED ORDER — LISINOPRIL-HYDROCHLOROTHIAZIDE 20-12.5 MG PO TABS: 1.0000 | ORAL_TABLET | Freq: Every day | ORAL | 3 refills | Status: DC

## 2021-12-23 MED ORDER — LEVOTHYROXINE SODIUM 112 MCG PO TABS: ORAL_TABLET | ORAL | 0 refills | Status: DC

## 2021-12-23 MED ORDER — LISINOPRIL-HYDROCHLOROTHIAZIDE 20-12.5 MG PO TABS: 1.0000 | ORAL_TABLET | Freq: Every day | ORAL | 0 refills | Status: DC

## 2021-12-23 NOTE — Progress Notes (Unsigned)
   [facility] [address] [place], [place]  [address] Phone:  [phone]   Fax:  [phone] 

## 2022-02-02 ENCOUNTER — Other Ambulatory Visit: Payer: Self-pay | Admitting: Nurse Practitioner

## 2022-02-06 ENCOUNTER — Other Ambulatory Visit: Payer: Self-pay | Admitting: Nurse Practitioner

## 2022-02-12 ENCOUNTER — Other Ambulatory Visit: Payer: Self-pay | Admitting: Nurse Practitioner

## 2022-02-24 NOTE — Patient Instructions (Addendum)
Carbohydrate Counting for Diabetes Mellitus, Adult Carbohydrate counting is a method of keeping track of how many carbohydrates you eat. Eating carbohydrates increases the amount of sugar (glucose) in the blood. Counting how many carbohydrates you eat improves how well you manage your blood glucose. This, in turn, helps you manage your diabetes. Carbohydrates are measured in grams (g) per serving. It is important to know how many carbohydrates (in grams or by serving size) you can have in each meal. This is different for every person. A dietitian can help you make a meal plan and calculate how many carbohydrates you should have at each meal and snack. What foods contain carbohydrates? Carbohydrates are found in the following foods: Grains, such as breads and cereals. Dried beans and soy products. Starchy vegetables, such as potatoes, peas, and corn. Fruit and fruit juices. Milk and yogurt. Sweets and snack foods, such as cake, cookies, candy, chips, and soft drinks. How do I count carbohydrates in foods? There are two ways to count carbohydrates in food. You can read food labels or learn standard serving sizes of foods. You can use either of these methods or a combination of both. Using the Nutrition Facts label The Nutrition Facts list is included on the labels of almost all packaged foods and beverages in the [place]. It includes: The serving size. Information about nutrients in each serving, including the grams of carbohydrate per serving. To use the Nutrition Facts, decide how many servings you will have. Then, multiply the number of servings by the number of carbohydrates per serving. The resulting number is the total grams of carbohydrates that you will be having. Learning the standard serving sizes of foods When you eat carbohydrate foods that are not packaged or do not include Nutrition Facts on the label, you need to measure the servings in order to count the grams of  carbohydrates. Measure the foods that you will eat with a food scale or measuring cup, if needed. Decide how many standard-size servings you will eat. Multiply the number of servings by 15. For foods that contain carbohydrates, one serving equals 15 g of carbohydrates. For example, if you eat 2 cups or 10 oz (300 g) of strawberries, you will have eaten 2 servings and 30 g of carbohydrates (2 servings x 15 g = 30 g). For foods that have more than one food mixed, such as soups and casseroles, you must count the carbohydrates in each food that is included. The following list contains standard serving sizes of common carbohydrate-rich foods. Each of these servings has about 15 g of carbohydrates: 1 slice of bread. 1 six-inch (15 cm) tortilla. ? cup or 2 oz (53 g) cooked rice or pasta.  cup or 3 oz (85 g) cooked or canned, drained and rinsed beans or lentils.  cup or 3 oz (85 g) starchy vegetable, such as peas, corn, or squash.  cup or 4 oz (120 g) hot cereal.  cup or 3 oz (85 g) boiled or mashed potatoes, or  or 3 oz (85 g) of a large baked potato.  cup or 4 fl oz (118 mL) fruit juice. 1 cup or 8 fl oz (237 mL) milk. 1 small or 4 oz (106 g) apple.  or 2 oz (63 g) of a medium banana. 1 cup or 5 oz (150 g) strawberries. 3 cups or 1 oz (28.3 g) popped popcorn. What is an example of carbohydrate counting? To calculate the grams of carbohydrates in this sample meal, follow the steps   shown below. Sample meal 3 oz (85 g) chicken breast. ? cup or 4 oz (106 g) brown rice.  cup or 3 oz (85 g) corn. 1 cup or 8 fl oz (237 mL) milk. 1 cup or 5 oz (150 g) strawberries with sugar-free whipped topping. Carbohydrate calculation Identify the foods that contain carbohydrates: Rice. Corn. Milk. Strawberries. Calculate how many servings you have of each food: 2 servings rice. 1 serving corn. 1 serving milk. 1 serving strawberries. Multiply each number of servings by 15 g: 2 servings rice x 15  g = 30 g. 1 serving corn x 15 g = 15 g. 1 serving milk x 15 g = 15 g. 1 serving strawberries x 15 g = 15 g. Add together all of the amounts to find the total grams of carbohydrates eaten: 30 g + 15 g + 15 g + 15 g = 75 g of carbohydrates total. What are tips for following this plan? Shopping Develop a meal plan and then make a shopping list. Buy fresh and frozen vegetables, fresh and frozen fruit, dairy, eggs, beans, lentils, and whole grains. Look at food labels. Choose foods that have more fiber and less sugar. Avoid processed foods and foods with added sugars. Meal planning Aim to have the same number of grams of carbohydrates at each meal and for each snack time. Plan to have regular, balanced meals and snacks. Where to find more information American Diabetes Association: diabetes.org Centers for Disease Control and Prevention: cdc.gov Academy of Nutrition and Dietetics: eatright.org Association of Diabetes Care & Education Specialists: diabeteseducator.org Summary Carbohydrate counting is a method of keeping track of how many carbohydrates you eat. Eating carbohydrates increases the amount of sugar (glucose) in your blood. Counting how many carbohydrates you eat improves how well you manage your blood glucose. This helps you manage your diabetes. A dietitian can help you make a meal plan and calculate how many carbohydrates you should have at each meal and snack. This information is not intended to replace advice given to you by your health care provider. Make sure you discuss any questions you have with your health care provider. Document Revised: 04/28/2020 Document Reviewed: 04/28/2020 Elsevier Patient Education  2023 Elsevier Inc. DASH Eating Plan DASH stands for Dietary Approaches to Stop Hypertension. The DASH eating plan is a healthy eating plan that has been shown to: Reduce high blood pressure (hypertension). Reduce your risk for type 2 diabetes, heart disease, and  stroke. Help with weight loss. What are tips for following this plan? Reading food labels Check food labels for the amount of salt (sodium) per serving. Choose foods with less than 5 percent of the Daily Value of sodium. Generally, foods with less than 300 milligrams (mg) of sodium per serving fit into this eating plan. To find whole grains, look for the word "whole" as the first word in the ingredient list. Shopping Buy products labeled as "low-sodium" or "no salt added." Buy fresh foods. Avoid canned foods and pre-made or frozen meals. Cooking Avoid adding salt when cooking. Use salt-free seasonings or herbs instead of table salt or sea salt. Check with your health care provider or pharmacist before using salt substitutes. Do not fry foods. Cook foods using healthy methods such as baking, boiling, grilling, roasting, and broiling instead. Cook with heart-healthy oils, such as olive, canola, avocado, soybean, or sunflower oil. Meal planning  Eat a balanced diet that includes: 4 or more servings of fruits and 4 or more servings of vegetables each day.   Try to fill one-half of your plate with fruits and vegetables. 6-8 servings of whole grains each day. Less than 6 oz (170 g) of lean meat, poultry, or fish each day. A 3-oz (85-g) serving of meat is about the same size as a deck of cards. One egg equals 1 oz (28 g). 2-3 servings of low-fat dairy each day. One serving is 1 cup (237 mL). 1 serving of nuts, seeds, or beans 5 times each week. 2-3 servings of heart-healthy fats. Healthy fats called omega-3 fatty acids are found in foods such as walnuts, flaxseeds, fortified milks, and eggs. These fats are also found in cold-water fish, such as sardines, salmon, and mackerel. Limit how much you eat of: Canned or prepackaged foods. Food that is high in trans fat, such as some fried foods. Food that is high in saturated fat, such as fatty meat. Desserts and other sweets, sugary drinks, and other foods  with added sugar. Full-fat dairy products. Do not salt foods before eating. Do not eat more than 4 egg yolks a week. Try to eat at least 2 vegetarian meals a week. Eat more home-cooked food and less restaurant, buffet, and fast food. Lifestyle When eating at a restaurant, ask that your food be prepared with less salt or no salt, if possible. If you drink alcohol: Limit how much you use to: 0-1 drink a day for women who are not pregnant. 0-2 drinks a day for men. Be aware of how much alcohol is in your drink. In the [place] one drink equals one 12 oz bottle of beer (355 mL), one 5 oz glass of wine (148 mL), or one 1 oz glass of hard liquor (44 mL). General information Avoid eating more than 2,300 mg of salt a day. If you have hypertension, you may need to reduce your sodium intake to 1,500 mg a day. Work with your health care provider to maintain a healthy body weight or to lose weight. Ask what an ideal weight is for you. Get at least 30 minutes of exercise that causes your heart to beat faster (aerobic exercise) most days of the week. Activities may include walking, swimming, or biking. Work with your health care provider or dietitian to adjust your eating plan to your individual calorie needs. What foods should I eat? Fruits All fresh, dried, or frozen fruit. Canned fruit in natural juice (without added sugar). Vegetables Fresh or frozen vegetables (raw, steamed, roasted, or grilled). Low-sodium or reduced-sodium tomato and vegetable juice. Low-sodium or reduced-sodium tomato sauce and tomato paste. Low-sodium or reduced-sodium canned vegetables. Grains Whole-grain or whole-wheat bread. Whole-grain or whole-wheat pasta. Brown rice. Oatmeal. Quinoa. Bulgur. Whole-grain and low-sodium cereals. Pita bread. Low-fat, low-sodium crackers. Whole-wheat flour tortillas. Meats and other proteins Skinless chicken or turkey. Ground chicken or turkey. Pork with fat trimmed off. Fish and seafood. Egg  whites. Dried beans, peas, or lentils. Unsalted nuts, nut butters, and seeds. Unsalted canned beans. Lean cuts of beef with fat trimmed off. Low-sodium, lean precooked or cured meat, such as sausages or meat loaves. Dairy Low-fat (1%) or fat-free (skim) milk. Reduced-fat, low-fat, or fat-free cheeses. Nonfat, low-sodium ricotta or cottage cheese. Low-fat or nonfat yogurt. Low-fat, low-sodium cheese. Fats and oils Soft margarine without trans fats. Vegetable oil. Reduced-fat, low-fat, or light mayonnaise and salad dressings (reduced-sodium). Canola, safflower, olive, avocado, soybean, and sunflower oils. Avocado. Seasonings and condiments Herbs. Spices. Seasoning mixes without salt. Other foods Unsalted popcorn and pretzels. Fat-free sweets. The items listed above may not be   a complete list of foods and beverages you can eat. Contact a dietitian for more information. What foods should I avoid? Fruits Canned fruit in a light or heavy syrup. Fried fruit. Fruit in cream or butter sauce. Vegetables Creamed or fried vegetables. Vegetables in a cheese sauce. Regular canned vegetables (not low-sodium or reduced-sodium). Regular canned tomato sauce and paste (not low-sodium or reduced-sodium). Regular tomato and vegetable juice (not low-sodium or reduced-sodium). Pickles. Olives. Grains Baked goods made with fat, such as croissants, muffins, or some breads. Dry pasta or rice meal packs. Meats and other proteins Fatty cuts of meat. Ribs. Fried meat. Bacon. Bologna, salami, and other precooked or cured meats, such as sausages or meat loaves. Fat from the back of a pig (fatback). Bratwurst. Salted nuts and seeds. Canned beans with added salt. Canned or smoked fish. Whole eggs or egg yolks. Chicken or turkey with skin. Dairy Whole or 2% milk, cream, and half-and-half. Whole or full-fat cream cheese. Whole-fat or sweetened yogurt. Full-fat cheese. Nondairy creamers. Whipped toppings. Processed cheese and  cheese spreads. Fats and oils Butter. Stick margarine. Lard. Shortening. Ghee. Bacon fat. Tropical oils, such as coconut, palm kernel, or palm oil. Seasonings and condiments Onion salt, garlic salt, seasoned salt, table salt, and sea salt. Worcestershire sauce. Tartar sauce. Barbecue sauce. Teriyaki sauce. Soy sauce, including reduced-sodium. Steak sauce. Canned and packaged gravies. Fish sauce. Oyster sauce. Cocktail sauce. Store-bought horseradish. Ketchup. Mustard. Meat flavorings and tenderizers. Bouillon cubes. Hot sauces. Pre-made or packaged marinades. Pre-made or packaged taco seasonings. Relishes. Regular salad dressings. Other foods Salted popcorn and pretzels. The items listed above may not be a complete list of foods and beverages you should avoid. Contact a dietitian for more information. Where to find more information National Heart, Lung, and Blood Institute: www.nhlbi.nih.gov American Heart Association: www.heart.org Academy of Nutrition and Dietetics: www.eatright.org National Kidney Foundation: www.kidney.org Summary The DASH eating plan is a healthy eating plan that has been shown to reduce high blood pressure (hypertension). It may also reduce your risk for type 2 diabetes, heart disease, and stroke. When on the DASH eating plan, aim to eat more fresh fruits and vegetables, whole grains, lean proteins, low-fat dairy, and heart-healthy fats. With the DASH eating plan, you should limit salt (sodium) intake to 2,300 mg a day. If you have hypertension, you may need to reduce your sodium intake to 1,500 mg a day. Work with your health care provider or dietitian to adjust your eating plan to your individual calorie needs. This information is not intended to replace advice given to you by your health care provider. Make sure you discuss any questions you have with your health care provider. Document Revised: 08/29/2019 Document Reviewed: 08/29/2019 Elsevier Patient Education  2023  Elsevier Inc.  

## 2022-03-01 ENCOUNTER — Ambulatory Visit: Payer: 59 | Admitting: Physician Assistant

## 2022-03-02 ENCOUNTER — Ambulatory Visit (INDEPENDENT_AMBULATORY_CARE_PROVIDER_SITE_OTHER): Payer: 59 | Admitting: Physician Assistant

## 2022-03-02 ENCOUNTER — Encounter: Payer: Self-pay | Admitting: Physician Assistant

## 2022-03-02 VITALS — BP 138/88 | HR 67 | Ht 59.0 in | Wt 121.0 lb

## 2022-03-02 DIAGNOSIS — F419 Anxiety disorder, unspecified: Secondary | ICD-10-CM | POA: Insufficient documentation

## 2022-03-02 DIAGNOSIS — I10 Essential (primary) hypertension: Secondary | ICD-10-CM | POA: Diagnosis not present

## 2022-03-02 DIAGNOSIS — E1165 Type 2 diabetes mellitus with hyperglycemia: Secondary | ICD-10-CM | POA: Diagnosis not present

## 2022-03-02 DIAGNOSIS — Z8639 Personal history of other endocrine, nutritional and metabolic disease: Secondary | ICD-10-CM

## 2022-03-02 DIAGNOSIS — E038 Other specified hypothyroidism: Secondary | ICD-10-CM | POA: Diagnosis not present

## 2022-03-02 LAB — POCT GLYCOSYLATED HEMOGLOBIN (HGB A1C): Hemoglobin A1C: 13.2 % — AB (ref 4.0–5.6)

## 2022-03-02 MED ORDER — HYDROXYZINE PAMOATE 25 MG PO CAPS: 25.0000 mg | ORAL_CAPSULE | Freq: Three times a day (TID) | ORAL | 0 refills | Status: DC | PRN

## 2022-03-02 MED ORDER — LISINOPRIL-HYDROCHLOROTHIAZIDE 20-12.5 MG PO TABS: 1.0000 | ORAL_TABLET | Freq: Every day | ORAL | 0 refills | Status: DC

## 2022-03-02 MED ORDER — METFORMIN HCL 1000 MG PO TABS: 1000.0000 mg | ORAL_TABLET | Freq: Two times a day (BID) | ORAL | 0 refills | Status: DC

## 2022-03-02 MED ORDER — LEVOTHYROXINE SODIUM 112 MCG PO TABS: ORAL_TABLET | ORAL | 0 refills | Status: DC

## 2022-03-02 NOTE — Assessment & Plan Note (Signed)
-  S/p ablation. Provided medication refill. Will collect thyroid function tests at f/up visit.

## 2022-03-02 NOTE — Assessment & Plan Note (Signed)
-  BP elevated on intake, BP repeated and improved but continues to be above goal of <130/80. Patient has not taken her blood pressure medication yet for today. Will continue to monitor and reassess blood pressure at f/up visit.

## 2022-03-02 NOTE — Assessment & Plan Note (Signed)
-  Situational anxiety. Per chart review, patient has previously tried Hydroxyzine 10 mg and Buspar. Will trial Hydroxyzine 25 mg to take before anxiety provoking situation. Will continue to monitor and reassess at f/up visit.

## 2022-03-02 NOTE — Assessment & Plan Note (Signed)
>>  ASSESSMENT AND PLAN FOR HYPOTHYROIDISM WRITTEN ON 03/02/2022  3:54 PM BY ABONZA, MARITZA, PA-C  -S/p ablation. Provided medication refill. Will collect thyroid function tests at f/up visit.

## 2022-03-02 NOTE — Assessment & Plan Note (Addendum)
-  A1c collected today and uncontrolled at 13.2 which is likely secondary to being without medication and personal stress. Discussed with patient restarting Metformin 1000 mg twice daily, advised to let me know if unable to tolerate due to GI side effects for treatment adjustments. Discussed with patient risks of uncontrolled diabetes mellitus including DKA. Patient verbalized understanding. Patient will work on following a diabetic diet. Recommend to continue with monitoring glucose intake. Will reassess A1c in 3 months, if fails to improve then recommend starting GLP-1 therapy. Per chart review, patient has previously been on Jardiance 25 mg, Lantus and Humalog with last A1c 12.8.

## 2022-03-02 NOTE — Progress Notes (Signed)
New Patient Office Visit  Subjective    Patient ID: Wendy Craig, female    DOB: [birth year]  Age: 51 y.o. MRN: [id number]  CC:  Chief Complaint  Patient presents with   New Patient (Initial Visit)    HPI Wendy Craig presents to establish care. Patient has a past medical history of type 2 diabetes mellitus, hypertension and hypothyroidism. Patient reports had radioablation for Grave's disease. Reports has been out of Metformin and Levothyroxine for about a month. States was taking Metformin 1000 mg once daily instead of twice daily. Has been checking her sugars which have been elevated in the 200s. States has been taking her blood pressure medication (Lisinopril-HCTZ) 20-12.5 mg. Patient also reports continues to struggling with getting anxious before driving. Patient has never been a smoker.    Outpatient Encounter Medications as of 03/02/2022  Medication Sig   hydrOXYzine (VISTARIL) 25 MG capsule Take 1 capsule (25 mg total) by mouth every 8 (eight) hours as needed for anxiety.   [DISCONTINUED] lisinopril-hydrochlorothiazide (ZESTORETIC) 20-12.5 MG tablet Take 1 tablet by mouth daily.   levothyroxine (SYNTHROID) 112 MCG tablet levothyroxine 112 mcg tablet  TAKE 1 TABLET BY MOUTH DAILY at BEDTIME, except on Sunday   lisinopril-hydrochlorothiazide (ZESTORETIC) 20-12.5 MG tablet Take 1 tablet by mouth daily.   metFORMIN (GLUCOPHAGE) 1000 MG tablet Take 1 tablet (1,000 mg total) by mouth 2 (two) times daily with a meal.   [DISCONTINUED] levothyroxine (SYNTHROID) 112 MCG tablet levothyroxine 112 mcg tablet  TAKE 1 TABLET BY MOUTH DAILY at BEDTIME, except on Sunday (Patient not taking: Reported on 03/02/2022)   [DISCONTINUED] metFORMIN (GLUCOPHAGE) 1000 MG tablet Take 1 tablet (1,000 mg total) by mouth 2 (two) times daily with a meal.   No facility-administered encounter medications on file as of 03/02/2022.    Past Medical History:  Diagnosis Date   Diabetes mellitus    HSV  infection              Infection on buttocks- June 2011   Hypertension    Thyroid disease Jan 2010   Graves disease: S/P I-131 in 2010    Past Surgical History:  Procedure Laterality Date   ABDOMINAL HYSTERECTOMY      Family History  Problem Relation Age of Onset   Diabetes Mother    Seizures Brother     Social History   Socioeconomic History   Marital status: Married    Spouse name: Bernette Redbird   Number of children: 2   Years of education: Not on file   Highest education level: Bachelor's degree (e.g., BA, AB, BS)  Occupational History   Occupation: foster care supervisor  Tobacco Use   Smoking status: Never   Smokeless tobacco: Never  Vaping Use   Vaping Use: Never used  Substance and Sexual Activity   Alcohol use: Yes    Alcohol/week: 5.0 standard drinks    Types: 5 Glasses of wine per week   Drug use: Never   Sexual activity: Yes    Partners: Male    Birth control/protection: None  Other Topics Concern   Not on file  Social History Narrative   Not on file   Social Determinants of Health   Financial Resource Strain: Not on file  Food Insecurity: Not on file  Transportation Needs: Not on file  Physical Activity: Not on file  Stress: Not on file  Social Connections: Not on file  Intimate Partner Violence: Not on file    ROS Review of Systems:  A fourteen system review of systems was performed and found to be positive as per HPI.      Objective    BP 138/88   Pulse 67   Ht 4\' 11"  (1.499 m)   Wt 121 lb 0.4 oz (54.9 kg)   SpO2 100%   BMI 24.44 kg/m   Physical Exam General:  Well Developed, well nourished, appropriate for stated age.  Neuro:  Alert and oriented,  extra-ocular muscles intact  HEENT:  Normocephalic, atraumatic, neck supple  Skin:  no gross rash, warm, pink. Cardiac:  RRR, S1 S2 Respiratory: CTA B/L  Vascular:  Ext warm, no cyanosis apprec.; cap RF less 2 sec. Psych:  No HI/SI, judgement and insight good, Euthymic mood. Full  Affect.      Assessment & Plan:   Problem List Items Addressed This Visit       Cardiovascular and Mediastinum   Essential hypertension    -BP elevated on intake, BP repeated and improved but continues to be above goal of <130/80. Patient has not taken her blood pressure medication yet for today. Will continue to monitor and reassess blood pressure at f/up visit.        Relevant Medications   lisinopril-hydrochlorothiazide (ZESTORETIC) 20-12.5 MG tablet     Endocrine   Uncontrolled type 2 diabetes mellitus with hyperglycemia (HCC) - Primary    -A1c collected today and uncontrolled at 13.2 which is likely secondary to being without medication and personal stress. Discussed with patient restarting Metformin 1000 mg twice daily, advised to let me know if unable to tolerate due to GI side effects for treatment adjustments. Discussed with patient risks of uncontrolled diabetes mellitus including DKA. Patient verbalized understanding. Patient will work on following a diabetic diet. Recommend to continue with monitoring glucose intake. Will reassess A1c in 3 months, if fails to improve then recommend starting GLP-1 therapy. Per chart review, patient has previously been on Jardiance 25 mg, Lantus and Humalog with last A1c 12.8.        Relevant Medications   lisinopril-hydrochlorothiazide (ZESTORETIC) 20-12.5 MG tablet   metFORMIN (GLUCOPHAGE) 1000 MG tablet   Other Relevant Orders   POCT glycosylated hemoglobin (Hb A1C) (Completed)   Hypothyroidism    -S/p ablation. Provided medication refill. Will collect thyroid function tests at f/up visit.       Relevant Medications   levothyroxine (SYNTHROID) 112 MCG tablet     Other   Anxiety    -Situational anxiety. Per chart review, patient has previously tried Hydroxyzine 10 mg and Buspar. Will trial Hydroxyzine 25 mg to take before anxiety provoking situation. Will continue to monitor and reassess at f/up visit.        Relevant  Medications   hydrOXYzine (VISTARIL) 25 MG capsule   Other Visit Diagnoses     History of Graves' disease           Return in about 3 months (around 06/02/2022) for DM, thyroid, blood pressure and FBW.   06/04/2022, PA-C

## 2022-06-01 ENCOUNTER — Ambulatory Visit: Payer: 59 | Admitting: Physician Assistant

## 2022-06-01 ENCOUNTER — Telehealth: Payer: Self-pay | Admitting: Physician Assistant

## 2022-06-01 ENCOUNTER — Encounter: Payer: Self-pay | Admitting: Physician Assistant

## 2022-06-01 VITALS — BP 124/70 | HR 80 | Temp 97.7°F | Ht 59.0 in | Wt 121.0 lb

## 2022-06-01 DIAGNOSIS — E038 Other specified hypothyroidism: Secondary | ICD-10-CM

## 2022-06-01 DIAGNOSIS — E119 Type 2 diabetes mellitus without complications: Secondary | ICD-10-CM | POA: Insufficient documentation

## 2022-06-01 DIAGNOSIS — I10 Essential (primary) hypertension: Secondary | ICD-10-CM | POA: Diagnosis not present

## 2022-06-01 DIAGNOSIS — E1165 Type 2 diabetes mellitus with hyperglycemia: Secondary | ICD-10-CM

## 2022-06-01 LAB — POCT GLYCOSYLATED HEMOGLOBIN (HGB A1C): Hemoglobin A1C: 10.5 % — AB (ref 4.0–5.6)

## 2022-06-01 MED ORDER — BLOOD GLUCOSE METER KIT: PACK | 0 refills | Status: AC

## 2022-06-01 MED ORDER — OZEMPIC (0.25 OR 0.5 MG/DOSE) 2 MG/1.5ML ~~LOC~~ SOPN: PEN_INJECTOR | SUBCUTANEOUS | 1 refills | Status: DC

## 2022-06-01 NOTE — Telephone Encounter (Signed)
Trulicity and Ozempic are the same class of drugs and will have the same side effects. Maritzas advise is for patient to take the sample she was given and see how it works.

## 2022-06-01 NOTE — Patient Instructions (Signed)

## 2022-06-01 NOTE — Assessment & Plan Note (Signed)
-  Asymptomatic. Will collect TSH and free T4. Continue current medication regimen. Pending lab results will adjust treatment plan if indicated.

## 2022-06-01 NOTE — Telephone Encounter (Signed)
Patient is worried about the side effects of the Ozempic and is asking if you would do the Trulicity instead? Please advise.

## 2022-06-01 NOTE — Assessment & Plan Note (Signed)
-  A1c has improved from 13.2 to 10.5 but remains above goal. Discussed GLP-1 therapy and patient is agreeable to Ozempic. Discussed potential side effects and advised to let me know if unable to tolerate medication. Will send for a new glucometer kit to monitor glucose at home. Will continue Metformin 1000 mg BID. Recommend to continue with diet changes and limit simple carbohydrates/sugar. Will reassess A1c and medication therapy in 3 months.

## 2022-06-01 NOTE — Telephone Encounter (Signed)
lmtc

## 2022-06-01 NOTE — Progress Notes (Signed)
Established patient visit   Patient: Wendy Craig   DOB: Apr 25, 1971   51 y.o. Female  MRN: [id number] Visit Date: 06/01/2022  Chief Complaint  Patient presents with   Diabetes   Subjective    HPI  Patient presents for chronic follow-up.   Diabetes: No increased urination or thirst. Pt reports medication compliance. No hypoglycemic events. Checking glucose at home occasionally. Reports PPS of 140. States has been better with monitoring sugar intake.   HTN: Pt denies chest pain, palpitations, headache, dizziness or lower extremity swelling. Taking medication as directed without side effects.   Thyroid: Reports medication compliance. Denies hoarseness, dysphagia or palpitations.   Medications: Outpatient Medications Prior to Visit  Medication Sig   hydrOXYzine (VISTARIL) 25 MG capsule Take 1 capsule (25 mg total) by mouth every 8 (eight) hours as needed for anxiety.   levothyroxine (SYNTHROID) 112 MCG tablet levothyroxine 112 mcg tablet  TAKE 1 TABLET BY MOUTH DAILY at BEDTIME, except on Sunday   lisinopril-hydrochlorothiazide (ZESTORETIC) 20-12.5 MG tablet Take 1 tablet by mouth daily.   metFORMIN (GLUCOPHAGE) 1000 MG tablet Take 1 tablet (1,000 mg total) by mouth 2 (two) times daily with a meal.   No facility-administered medications prior to visit.    Review of Systems Review of Systems:  A fourteen system review of systems was performed and found to be positive as per HPI.  Last CBC Lab Results  Component Value Date   WBC 9.1 06/23/2020   HGB 12.9 06/23/2020   HCT 38.0 06/23/2020   MCV 83.4 06/23/2020   MCH 28.3 06/23/2020   RDW 12.4 06/23/2020   PLT 282 01/56/1537   Last metabolic panel Lab Results  Component Value Date   GLUCOSE 123 (H) 06/25/2020   NA 134 (L) 06/25/2020   K 3.1 (L) 06/25/2020   CL 108 06/25/2020   CO2 24 06/25/2020   BUN 6 06/25/2020   CREATININE 0.67 06/25/2020   GFRNONAA >60 06/25/2020   CALCIUM 8.3 (L) 06/25/2020   PROT 7.5  06/23/2020   ALBUMIN 4.0 06/23/2020   LABGLOB 3.3 09/06/2017   AGRATIO 1.3 09/06/2017   BILITOT 0.6 06/23/2020   ALKPHOS 70 06/23/2020   AST 23 06/23/2020   ALT 18 06/23/2020   ANIONGAP 2 (L) 06/25/2020   Last lipids Lab Results  Component Value Date   CHOL 224 (H) 07/09/2020   HDL 57 07/09/2020   LDLCALC 154 (H) 07/09/2020   LDLDIRECT 164 (H) 09/16/2013   TRIG 77 07/09/2020   CHOLHDL 3.9 07/09/2020   Last hemoglobin A1c Lab Results  Component Value Date   HGBA1C 10.5 (A) 06/01/2022   Last thyroid functions Lab Results  Component Value Date   TSH 4.969 (H) 06/23/2020   T4TOTAL 8.2 11/10/2014   Last vitamin D Lab Results  Component Value Date   VD25OH 12 (L) 11/10/2014       Objective    BP 124/70   Pulse 80   Temp 97.7 F (36.5 C)   Ht _0  (1.499 m)   Wt 121 lb (54.9 kg)   SpO2 100%   BMI 24.44 kg/m  BP Readings from Last 3 Encounters:  06/01/22 124/70  03/02/22 138/88  08/09/20 (!) 157/92   Wt Readings from Last 3 Encounters:  06/01/22 121 lb (54.9 kg)  03/02/22 121 lb 0.4 oz (54.9 kg)  11/04/20 115 lb (52.2 kg)    Physical Exam  General:  Pleasant and cooperative, appropriate for stated age.  Neuro:  Alert and oriented,  extra-ocular muscles intact  HEENT:  Normocephalic, atraumatic, neck supple  Skin:  no gross rash, warm, pink. Cardiac:  RRR, S1 S2 Respiratory: CTA B/L  Vascular:  Ext warm, no cyanosis apprec.; cap RF less 2 sec. Psych:  No HI/SI, judgement and insight good, Euthymic mood. Full Affect.   Results for orders placed or performed in visit on 06/01/22  POCT glycosylated hemoglobin (Hb A1C)  Result Value Ref Range   Hemoglobin A1C 10.5 (A) 4.0 - 5.6 %   HbA1c POC (<> result, manual entry)     HbA1c, POC (prediabetic range)     HbA1c, POC (controlled diabetic range)      Assessment & Plan      Problem List Items Addressed This Visit       Cardiovascular and Mediastinum   Essential hypertension    -BP elevated on  intake, repeated and improved. At goal. Continue current medication regimen. Will collect CMP for medication monitoring. Will continue to monitor.      Relevant Orders   CBC w/Diff   Comp Met (CMET)     Endocrine   Uncontrolled type 2 diabetes mellitus with hyperglycemia ([place]) - Primary    -A1c has improved from 13.2 to 10.5 but remains above goal. Discussed GLP-1 therapy and patient is agreeable to Ozempic. Discussed potential side effects and advised to let me know if unable to tolerate medication. Will send for a new glucometer kit to monitor glucose at home. Will continue Metformin 1000 mg BID. Recommend to continue with diet changes and limit simple carbohydrates/sugar. Will reassess A1c and medication therapy in 3 months.      Relevant Medications   Semaglutide,0.25 or 0.5MG/DOS, (OZEMPIC, 0.25 OR 0.5 MG/DOSE,) 2 MG/1.5ML SOPN   blood glucose meter kit and supplies   Other Relevant Orders   POCT glycosylated hemoglobin (Hb A1C) (Completed)   CBC w/Diff   Comp Met (CMET)   TSH   T4, free   Lipid Profile   Hypothyroidism    -Asymptomatic. Will collect TSH and free T4. Continue current medication regimen. Pending lab results will adjust treatment plan if indicated.      Relevant Orders   TSH   T4, free    Return in about 3 months (around 09/01/2022) for DM, HTN.        Lorrene Reid, PA-C  [facility] at [facility] [phone] (phone) [phone] (fax)  [place]

## 2022-06-01 NOTE — Telephone Encounter (Signed)
Does she stop the metformin when starting the Ozempic?

## 2022-06-01 NOTE — Assessment & Plan Note (Signed)
>>  ASSESSMENT AND PLAN FOR HYPOTHYROIDISM WRITTEN ON 06/01/2022  8:38 AM BY ABONZA, MARITZA, PA-C  -Asymptomatic. Will collect TSH and free T4. Continue current medication regimen. Pending lab results will adjust treatment plan if indicated.

## 2022-06-01 NOTE — Assessment & Plan Note (Signed)
-  BP elevated on intake, repeated and improved. At goal. Continue current medication regimen. Will collect CMP for medication monitoring. Will continue to monitor.

## 2022-06-01 NOTE — Telephone Encounter (Signed)
No. This was discussed with patient while she was in the room.

## 2022-06-02 ENCOUNTER — Other Ambulatory Visit: Payer: Self-pay | Admitting: Physician Assistant

## 2022-06-02 DIAGNOSIS — E038 Other specified hypothyroidism: Secondary | ICD-10-CM

## 2022-06-02 LAB — LIPID PANEL
Chol/HDL Ratio: 3.3 ratio (ref 0.0–4.4)
Cholesterol, Total: 222 mg/dL — ABNORMAL HIGH (ref 100–199)
HDL: 68 mg/dL (ref >39)
LDL Chol Calc (NIH): 142 mg/dL — ABNORMAL HIGH (ref 0–99)
Triglycerides: 69 mg/dL (ref 0–149)
VLDL Cholesterol Cal: 12 mg/dL (ref 5–40)

## 2022-06-02 LAB — CBC WITH DIFFERENTIAL/PLATELET
Basophils Absolute: 0.1 10*3/uL (ref 0.0–0.2)
Basos: 1 %
EOS (ABSOLUTE): 0.2 10*3/uL (ref 0.0–0.4)
Eos: 2 %
Hematocrit: 33.5 % — ABNORMAL LOW (ref 34.0–46.6)
Hemoglobin: 10.9 g/dL — ABNORMAL LOW (ref 11.1–15.9)
Immature Grans (Abs): 0 10*3/uL (ref 0.0–0.1)
Immature Granulocytes: 0 %
Lymphocytes Absolute: 1.7 10*3/uL (ref 0.7–3.1)
Lymphs: 28 %
MCH: 28.8 pg (ref 26.6–33.0)
MCHC: 32.5 g/dL (ref 31.5–35.7)
MCV: 88 fL (ref 79–97)
Monocytes Absolute: 0.7 10*3/uL (ref 0.1–0.9)
Monocytes: 11 %
Neutrophils Absolute: 3.6 10*3/uL (ref 1.4–7.0)
Neutrophils: 58 %
Platelets: 307 10*3/uL (ref 150–450)
RBC: 3.79 x10E6/uL (ref 3.77–5.28)
RDW: 12.3 % (ref 11.7–15.4)
WBC: 6.2 10*3/uL (ref 3.4–10.8)

## 2022-06-02 LAB — COMPREHENSIVE METABOLIC PANEL
ALT: 8 IU/L (ref 0–32)
AST: 13 IU/L (ref 0–40)
Albumin/Globulin Ratio: 1.5 (ref 1.2–2.2)
Albumin: 4.1 g/dL (ref 3.8–4.9)
Alkaline Phosphatase: 65 IU/L (ref 44–121)
BUN/Creatinine Ratio: 14 (ref 9–23)
BUN: 14 mg/dL (ref 6–24)
Bilirubin Total: 0.2 mg/dL (ref 0.0–1.2)
CO2: 22 mmol/L (ref 20–29)
Calcium: 9.7 mg/dL (ref 8.7–10.2)
Chloride: 105 mmol/L (ref 96–106)
Creatinine, Ser: 0.97 mg/dL (ref 0.57–1.00)
Globulin, Total: 2.8 g/dL (ref 1.5–4.5)
Glucose: 176 mg/dL — ABNORMAL HIGH (ref 70–99)
Potassium: 4.7 mmol/L (ref 3.5–5.2)
Sodium: 140 mmol/L (ref 134–144)
Total Protein: 6.9 g/dL (ref 6.0–8.5)
eGFR: 70 mL/min/{1.73_m2} (ref >59)

## 2022-06-02 LAB — T4, FREE: Free T4: 2.08 ng/dL — ABNORMAL HIGH (ref 0.82–1.77)

## 2022-06-02 LAB — TSH: TSH: 0.165 u[IU]/mL — ABNORMAL LOW (ref 0.450–4.500)

## 2022-06-02 MED ORDER — LEVOTHYROXINE SODIUM 100 MCG PO TABS: 100.0000 ug | ORAL_TABLET | Freq: Every day | ORAL | 1 refills | Status: DC

## 2022-08-24 ENCOUNTER — Ambulatory Visit: Payer: 59 | Admitting: Physician Assistant

## 2022-08-24 ENCOUNTER — Encounter: Payer: Self-pay | Admitting: Physician Assistant

## 2022-08-24 VITALS — BP 138/88 | HR 84 | Resp 18 | Ht 59.0 in | Wt 121.0 lb

## 2022-08-24 DIAGNOSIS — E1165 Type 2 diabetes mellitus with hyperglycemia: Secondary | ICD-10-CM | POA: Diagnosis not present

## 2022-08-24 DIAGNOSIS — E038 Other specified hypothyroidism: Secondary | ICD-10-CM

## 2022-08-24 DIAGNOSIS — D649 Anemia, unspecified: Secondary | ICD-10-CM

## 2022-08-24 DIAGNOSIS — F419 Anxiety disorder, unspecified: Secondary | ICD-10-CM

## 2022-08-24 DIAGNOSIS — I10 Essential (primary) hypertension: Secondary | ICD-10-CM | POA: Diagnosis not present

## 2022-08-24 DIAGNOSIS — E1169 Type 2 diabetes mellitus with other specified complication: Secondary | ICD-10-CM | POA: Insufficient documentation

## 2022-08-24 DIAGNOSIS — E785 Hyperlipidemia, unspecified: Secondary | ICD-10-CM

## 2022-08-24 LAB — POCT GLYCOSYLATED HEMOGLOBIN (HGB A1C): HbA1c POC (<> result, manual entry): 7.8 % (ref 4.0–5.6)

## 2022-08-24 NOTE — Assessment & Plan Note (Signed)
-  Situational. Recommend to continue with hydroxyzine 25 mg TID as needed for severe anxiety and non-pharmacologic therapy such as deep breathing and positive thinking.    

## 2022-08-24 NOTE — Assessment & Plan Note (Addendum)
-  A1c has improved from 10.5 to 7.8, patient will continue to work on diet changes and physical activity. Will continue Metformin 1000 mg twice daily. Will discontinue Ozempic due to side effects. Discussed with patient if A1c not at goal of less than 7.0 at follow-up visit then recommend treatment adjustments. Pt verbalized understanding. Recommend routine ambulatory glucose monitoring.

## 2022-08-24 NOTE — Assessment & Plan Note (Signed)
-  Last thyroid labs abnormal and levothyroxine was adjusted to 100 mcg. Will repeat thyroid labs today.

## 2022-08-24 NOTE — Assessment & Plan Note (Addendum)
-  Last lipid panel: HDL 68, LDL 142 (goal<70). Will repeat lipid panel today. If LDL remains above goal then recommend considering statin therapy. Recommend to follow a heart healthy diet low in fat. 

## 2022-08-24 NOTE — Assessment & Plan Note (Signed)
-  Relatively stable. Recommend to continue monitoring and if BP consistently >130/80 then recommend medication adjustments. Last BP in office was 124/70. Continue Lisinopril-HCTZ 20-12.5 mg daily. Will collect CMP for medication monitoring. Recommend to continue exercise and low sodium diet.

## 2022-08-24 NOTE — Patient Instructions (Signed)

## 2022-08-24 NOTE — Assessment & Plan Note (Signed)
>>  ASSESSMENT AND PLAN FOR HYPOTHYROIDISM WRITTEN ON 08/24/2022 10:14 AM BY ABONZA, MARITZA, PA-C  -Last thyroid labs abnormal and levothyroxine was adjusted to 100 mcg. Will repeat thyroid labs today.

## 2022-08-24 NOTE — Progress Notes (Signed)
Established patient visit   Patient: Wendy Craig   DOB: [birth year]   51 y.o. Female  MRN: [id number] Visit Date: 08/24/2022  Chief Complaint  Patient presents with   Follow-up    Fasting   Diabetes   Hypertension   Subjective    HPI HPI     Follow-up    Additional comments: Fasting      Last edited by Gemma Payor, CMA on 08/24/2022  9:16 AM.      Patient presents for chronic follow-up visit.  Diabetes mellitus: Pt denies increased urination or thirst. Pt reports medication compliance with Metformin. Reports Ozempic caused some upset stomach and belching. No hypoglycemic events. Checking glucose at home occasionally.   HTN: Pt denies chest pain, palpitations, dizziness or lower extremity swelling. Taking medication as directed without side effects. Checks BP at home and readings range in 130-140/80s. Pt follows a low salt diet.  HLD: Pt taking medication as directed without issues. Denies side effects including myalgias and RUQ pain.   Thyroid: Reports medication compliance. No palpitations  Anemia: Patient denies prior history of anemia. Denies melena, hematochezia or fatigue. Pt is s/p hysterectomy, no periods.  Anxiety: Patient reports situational, usually related to driving. Rarely takes hydroxyzine.   Medications: Outpatient Medications Prior to Visit  Medication Sig   blood glucose meter kit and supplies Dispense based on patient and insurance preference. Use up to four times daily as directed. (FOR ICD-10 E10.9, E11.9).   hydrOXYzine (VISTARIL) 25 MG capsule Take 1 capsule (25 mg total) by mouth every 8 (eight) hours as needed for anxiety.   levothyroxine (SYNTHROID) 100 MCG tablet Take 1 tablet (100 mcg total) by mouth daily.   lisinopril-hydrochlorothiazide (ZESTORETIC) 20-12.5 MG tablet Take 1 tablet by mouth daily.   [DISCONTINUED] Semaglutide,0.25 or 0.5MG/DOS, (OZEMPIC, 0.25 OR 0.5 MG/DOSE,) 2 MG/1.5ML SOPN Inject 0.25 mg into skin once a week x 4  weeks. Then inject 0.5 mg into skin once a week.   metFORMIN (GLUCOPHAGE) 1000 MG tablet Take 1 tablet (1,000 mg total) by mouth 2 (two) times daily with a meal.   No facility-administered medications prior to visit.    Review of Systems Review of Systems:  A fourteen system review of systems was performed and found to be positive as per HPI.  Last CBC Lab Results  Component Value Date   WBC 6.2 06/01/2022   HGB 10.9 (L) 06/01/2022   HCT 33.5 (L) 06/01/2022   MCV 88 06/01/2022   MCH 28.8 06/01/2022   RDW 12.3 06/01/2022   PLT 307 97/67/3419   Last metabolic panel Lab Results  Component Value Date   GLUCOSE 176 (H) 06/01/2022   NA 140 06/01/2022   K 4.7 06/01/2022   CL 105 06/01/2022   CO2 22 06/01/2022   BUN 14 06/01/2022   CREATININE 0.97 06/01/2022   EGFR 70 06/01/2022   CALCIUM 9.7 06/01/2022   PROT 6.9 06/01/2022   ALBUMIN 4.1 06/01/2022   LABGLOB 2.8 06/01/2022   AGRATIO 1.5 06/01/2022   BILITOT 0.2 06/01/2022   ALKPHOS 65 06/01/2022   AST 13 06/01/2022   ALT 8 06/01/2022   ANIONGAP 2 (L) 06/25/2020   Last lipids Lab Results  Component Value Date   CHOL 222 (H) 06/01/2022   HDL 68 06/01/2022   LDLCALC 142 (H) 06/01/2022   LDLDIRECT 164 (H) 09/16/2013   TRIG 69 06/01/2022   CHOLHDL 3.3 06/01/2022   Last hemoglobin A1c Lab Results  Component Value Date   HGBA1C  7.8 08/24/2022   Last thyroid functions Lab Results  Component Value Date   TSH 0.165 (L) 06/01/2022   T4TOTAL 8.2 11/10/2014   Last vitamin D Lab Results  Component Value Date   VD25OH 12 (L) 11/10/2014       Objective    BP 138/88   Pulse 84   Resp 18   Ht _0  (1.499 m)   Wt 121 lb (54.9 kg)   SpO2 100%   BMI 24.44 kg/m  BP Readings from Last 3 Encounters:  08/24/22 138/88  06/01/22 124/70  03/02/22 138/88   Wt Readings from Last 3 Encounters:  08/24/22 121 lb (54.9 kg)  06/01/22 121 lb (54.9 kg)  03/02/22 121 lb 0.4 oz (54.9 kg)    Physical Exam  General:   Well Developed, well nourished, appropriate for stated age.  Neuro:  Alert and oriented,  extra-ocular muscles intact  HEENT:  Normocephalic, atraumatic, neck supple  Skin:  no gross rash, warm, pink. Cardiac:  RRR, S1 S2 Respiratory: CTA B/L w/o wheezing, rhonchi or crackles.   Vascular:  Ext warm, no cyanosis apprec.; cap RF less 2 sec. No edema. Psych:  No HI/SI, judgement and insight good, Euthymic mood. Full Affect.   Results for orders placed or performed in visit on 08/24/22  POCT HgB A1C  Result Value Ref Range   Hemoglobin A1C     HbA1c POC (<> result, manual entry) 7.8 4.0 - 5.6 %   HbA1c, POC (prediabetic range)     HbA1c, POC (controlled diabetic range)      Assessment & Plan      Problem List Items Addressed This Visit       Cardiovascular and Mediastinum   Essential hypertension    -Relatively stable. Recommend to continue monitoring and if BP consistently >130/80 then recommend medication adjustments. Last BP in office was 124/70. Continue Lisinopril-HCTZ 20-12.5 mg daily. Will collect CMP for medication monitoring. Recommend to continue exercise and low sodium diet.      Relevant Orders   Comp Met (CMET)   CBC w/Diff     Endocrine   Uncontrolled type 2 diabetes mellitus with hyperglycemia ([place]) - Primary    -A1c has improved from 10.5 to 7.8, patient will continue to work on diet changes and physical activity. Will continue Metformin 1000 mg twice daily. Will discontinue Ozempic due to side effects. Discussed with patient if A1c not at goal of less than 7.0 at follow-up visit then recommend treatment adjustments. Pt verbalized understanding. Recommend routine ambulatory glucose monitoring.      Relevant Orders   POCT HgB A1C (Completed)   Comp Met (CMET)   CBC w/Diff   Hypothyroidism    -Last thyroid labs abnormal and levothyroxine was adjusted to 100 mcg. Will repeat thyroid labs today.      Relevant Orders   TSH   T4, free   T3   Hyperlipidemia  associated with type 2 diabetes mellitus ([place])    -Last lipid panel: HDL 68, LDL 142 (goal<70). Will repeat lipid panel today. If LDL remains above goal then recommend considering statin therapy. Recommend to follow a heart healthy diet low in fat.      Relevant Orders   Lipid Profile     Other   Anxiety    -Situational. Recommend to continue with hydroxyzine 25 mg TID as needed for severe anxiety and non-pharmacologic therapy such as deep breathing and positive thinking.       Other Visit Diagnoses  Anemia, unspecified type       Relevant Orders   CBC w/Diff   Iron, TIBC and Ferritin Panel      Anemia: -Asx. Last CBC: hemoglobin 10.9, hematocrit 33.5. Pt failed to follow-up for lab visit as advised so will repeat CBC and also collect iron panel.   Return in about 3 months (around 11/24/2022) for DM, HTN, HLD.        Lorrene Reid, PA-C  [facility] at [facility] [phone] (phone) [phone] (fax)  [place]

## 2022-08-25 LAB — COMPREHENSIVE METABOLIC PANEL
ALT: 6 IU/L (ref 0–32)
AST: 11 IU/L (ref 0–40)
Albumin/Globulin Ratio: 1.6 (ref 1.2–2.2)
Albumin: 4.5 g/dL (ref 3.8–4.9)
Alkaline Phosphatase: 54 IU/L (ref 44–121)
BUN/Creatinine Ratio: 12 (ref 9–23)
BUN: 12 mg/dL (ref 6–24)
Bilirubin Total: 0.3 mg/dL (ref 0.0–1.2)
CO2: 25 mmol/L (ref 20–29)
Calcium: 10.2 mg/dL (ref 8.7–10.2)
Chloride: 104 mmol/L (ref 96–106)
Creatinine, Ser: 0.99 mg/dL (ref 0.57–1.00)
Globulin, Total: 2.9 g/dL (ref 1.5–4.5)
Glucose: 124 mg/dL — ABNORMAL HIGH (ref 70–99)
Potassium: 4.5 mmol/L (ref 3.5–5.2)
Sodium: 140 mmol/L (ref 134–144)
Total Protein: 7.4 g/dL (ref 6.0–8.5)
eGFR: 68 mL/min/{1.73_m2} (ref >59)

## 2022-08-25 LAB — CBC WITH DIFFERENTIAL/PLATELET
Basophils Absolute: 0.1 10*3/uL (ref 0.0–0.2)
Basos: 1 %
EOS (ABSOLUTE): 0.1 10*3/uL (ref 0.0–0.4)
Eos: 2 %
Hematocrit: 34.2 % (ref 34.0–46.6)
Hemoglobin: 11.3 g/dL (ref 11.1–15.9)
Immature Grans (Abs): 0 10*3/uL (ref 0.0–0.1)
Immature Granulocytes: 0 %
Lymphocytes Absolute: 1.9 10*3/uL (ref 0.7–3.1)
Lymphs: 28 %
MCH: 29 pg (ref 26.6–33.0)
MCHC: 33 g/dL (ref 31.5–35.7)
MCV: 88 fL (ref 79–97)
Monocytes Absolute: 0.6 10*3/uL (ref 0.1–0.9)
Monocytes: 9 %
Neutrophils Absolute: 4.2 10*3/uL (ref 1.4–7.0)
Neutrophils: 60 %
Platelets: 311 10*3/uL (ref 150–450)
RBC: 3.9 x10E6/uL (ref 3.77–5.28)
RDW: 13.2 % (ref 11.7–15.4)
WBC: 6.9 10*3/uL (ref 3.4–10.8)

## 2022-08-25 LAB — IRON,TIBC AND FERRITIN PANEL
Ferritin: 37 ng/mL (ref 15–150)
Iron Saturation: 17 % (ref 15–55)
Iron: 59 ug/dL (ref 27–159)
Total Iron Binding Capacity: 350 ug/dL (ref 250–450)
UIBC: 291 ug/dL (ref 131–425)

## 2022-08-25 LAB — LIPID PANEL
Chol/HDL Ratio: 3.8 ratio (ref 0.0–4.4)
Cholesterol, Total: 250 mg/dL — ABNORMAL HIGH (ref 100–199)
HDL: 65 mg/dL (ref >39)
LDL Chol Calc (NIH): 169 mg/dL — ABNORMAL HIGH (ref 0–99)
Triglycerides: 91 mg/dL (ref 0–149)
VLDL Cholesterol Cal: 16 mg/dL (ref 5–40)

## 2022-08-25 LAB — TSH: TSH: 14 u[IU]/mL — ABNORMAL HIGH (ref 0.450–4.500)

## 2022-08-25 LAB — T4, FREE: Free T4: 1.23 ng/dL (ref 0.82–1.77)

## 2022-08-25 LAB — T3: T3, Total: 65 ng/dL — ABNORMAL LOW (ref 71–180)

## 2022-08-28 ENCOUNTER — Telehealth: Payer: Self-pay

## 2022-08-28 ENCOUNTER — Other Ambulatory Visit: Payer: Self-pay

## 2022-08-28 DIAGNOSIS — E038 Other specified hypothyroidism: Secondary | ICD-10-CM

## 2022-08-28 DIAGNOSIS — Z8639 Personal history of other endocrine, nutritional and metabolic disease: Secondary | ICD-10-CM

## 2022-08-28 DIAGNOSIS — E1169 Type 2 diabetes mellitus with other specified complication: Secondary | ICD-10-CM

## 2022-08-28 DIAGNOSIS — D649 Anemia, unspecified: Secondary | ICD-10-CM

## 2022-08-28 NOTE — Telephone Encounter (Signed)
Pt returned the call in regards to the lab results. I advised the pt of [facility] Result note stating " iron panel, kidney and liver function are normal. Hemoglobin and hematocrit have improved and normal. Bad cholesterol has increased from 142 to 169 and patient's 10-year risk score for a heart attack is 13.2% so recommend starting statin therapy such as rosuvastatin 5 mg or atorvastatin 10 mg. If patient is agreeable, recommend sending in a rx and repeating cholesterol panel and liver function in 6 weeks. Free T4 has improved and normal, but total T3 is mildly low and TSH is elevated. Levothyroxine was recently adjusted so recommend repeating thyroid labs in 6 weeks to confirm medication dose needs to be adjusted again. If TSH continues to fluctuate then recommend considering taking brand only- Synthroid vs generic- levothyroxine."  Pt didn't want to start the statin therapy at this time. Pt wants to make some diet adjustments and scheduled repeat labs for 12/28 for both repeating cholesterol panel and liver function in 6 weeks and repeating thyroid labs in 6 weeks.  FYI

## 2022-09-13 ENCOUNTER — Other Ambulatory Visit: Payer: Self-pay

## 2022-09-13 DIAGNOSIS — E038 Other specified hypothyroidism: Secondary | ICD-10-CM

## 2022-09-13 MED ORDER — LEVOTHYROXINE SODIUM 100 MCG PO TABS: 100.0000 ug | ORAL_TABLET | Freq: Every day | ORAL | 0 refills | Status: DC

## 2022-09-18 ENCOUNTER — Other Ambulatory Visit: Payer: Self-pay | Admitting: Nurse Practitioner

## 2022-09-18 DIAGNOSIS — I10 Essential (primary) hypertension: Secondary | ICD-10-CM

## 2022-10-05 ENCOUNTER — Other Ambulatory Visit: Payer: 59

## 2022-10-05 DIAGNOSIS — E1169 Type 2 diabetes mellitus with other specified complication: Secondary | ICD-10-CM

## 2022-10-05 DIAGNOSIS — E1165 Type 2 diabetes mellitus with hyperglycemia: Secondary | ICD-10-CM

## 2022-10-05 DIAGNOSIS — I10 Essential (primary) hypertension: Secondary | ICD-10-CM

## 2022-10-06 LAB — COMPREHENSIVE METABOLIC PANEL
ALT: 11 IU/L (ref 0–32)
AST: 14 IU/L (ref 0–40)
Albumin/Globulin Ratio: 1.5 (ref 1.2–2.2)
Albumin: 4.4 g/dL (ref 3.8–4.9)
Alkaline Phosphatase: 64 IU/L (ref 44–121)
BUN/Creatinine Ratio: 11 (ref 9–23)
BUN: 12 mg/dL (ref 6–24)
Bilirubin Total: 0.5 mg/dL (ref 0.0–1.2)
CO2: 24 mmol/L (ref 20–29)
Calcium: 10.2 mg/dL (ref 8.7–10.2)
Chloride: 102 mmol/L (ref 96–106)
Creatinine, Ser: 1.07 mg/dL — ABNORMAL HIGH (ref 0.57–1.00)
Globulin, Total: 3 g/dL (ref 1.5–4.5)
Glucose: 139 mg/dL — ABNORMAL HIGH (ref 70–99)
Potassium: 4.8 mmol/L (ref 3.5–5.2)
Sodium: 138 mmol/L (ref 134–144)
Total Protein: 7.4 g/dL (ref 6.0–8.5)
eGFR: 62 mL/min/{1.73_m2} (ref >59)

## 2022-10-06 LAB — LIPID PANEL
Chol/HDL Ratio: 3.5 ratio (ref 0.0–4.4)
Cholesterol, Total: 259 mg/dL — ABNORMAL HIGH (ref 100–199)
HDL: 73 mg/dL (ref >39)
LDL Chol Calc (NIH): 173 mg/dL — ABNORMAL HIGH (ref 0–99)
Triglycerides: 76 mg/dL (ref 0–149)
VLDL Cholesterol Cal: 13 mg/dL (ref 5–40)

## 2022-11-22 NOTE — Progress Notes (Deleted)
   Established Patient Office Visit  Subjective   Patient ID: Wendy Craig, female    DOB: Feb 28, 1971  Age: 52 y.o. MRN: [id number]  No chief complaint on file.   HPI  {History (Optional):23778}  ROS    Objective:     There were no vitals taken for this visit. {Vitals History (Optional):23777}  Physical Exam   No results found for any visits on 11/24/22.  {Labs (Optional):23779}  The 10-year ASCVD risk score (Arnett DK, et al., 2019) is: 13%    Assessment & Plan:   Problem List Items Addressed This Visit   None   No follow-ups on file.  rosuvastatin 5 mg or atorvastatin 10 mg  repeating cholesterol panel and liver function in 6 weeks  Anemia: -Asx. Last CBC: hemoglobin 10.9, hematocrit 33.5. Pt failed to follow-up for lab visit as advised so will repeat CBC and also collect iron panel.    Return in about 3 months (around 11/24/2022) for DM, HTN, HLD. Velva Harman, PA

## 2022-11-24 ENCOUNTER — Ambulatory Visit: Payer: 59 | Admitting: Family Medicine

## 2022-11-24 DIAGNOSIS — E1165 Type 2 diabetes mellitus with hyperglycemia: Secondary | ICD-10-CM | POA: Insufficient documentation

## 2022-11-24 NOTE — Assessment & Plan Note (Deleted)
-  Situational. Recommend to continue with hydroxyzine 25 mg TID as needed for severe anxiety and non-pharmacologic therapy such as deep breathing and positive thinking.

## 2022-11-24 NOTE — Assessment & Plan Note (Deleted)
A1c has improved from 10.5 to 7.8, patient will continue to work on diet changes and physical activity. Will continue Metformin 1000 mg twice daily. Will discontinue Ozempic due to side effects. Discussed with patient if A1c not at goal of less than 7.0 at follow-up visit then recommend treatment adjustments. Pt verbalized understanding. Recommend routine ambulatory glucose monitoring.

## 2022-11-24 NOTE — Assessment & Plan Note (Deleted)
-  Last lipid panel: HDL 68, LDL 142 (goal<70). Will repeat lipid panel today. If LDL remains above goal then recommend considering statin therapy. Recommend to follow a heart healthy diet low in fat.

## 2022-11-24 NOTE — Assessment & Plan Note (Deleted)
Relatively stable. Recommend to continue monitoring and if BP consistently >130/80 then recommend medication adjustments. Last BP in office was 124/70. Continue Lisinopril-HCTZ 20-12.5 mg daily. Will collect CMP for medication monitoring. Recommend to continue exercise and low sodium diet.

## 2022-11-24 NOTE — Assessment & Plan Note (Signed)
>>  ASSESSMENT AND PLAN FOR HYPOTHYROIDISM WRITTEN ON 11/24/2022  8:07 AM BY Rhenda [place] A, PA  Last labs abnormal and levothyroxine was adjusted to 100 mcg. Will repeat thyroid labs today.

## 2022-11-24 NOTE — Assessment & Plan Note (Deleted)
Last labs abnormal and levothyroxine was adjusted to 100 mcg. Will repeat thyroid labs today.

## 2022-12-08 ENCOUNTER — Telehealth: Payer: Self-pay | Admitting: *Deleted

## 2022-12-08 ENCOUNTER — Encounter: Payer: Self-pay | Admitting: Family Medicine

## 2022-12-08 ENCOUNTER — Ambulatory Visit: Payer: 59 | Admitting: Family Medicine

## 2022-12-08 VITALS — BP 172/113 | HR 77 | Ht 59.0 in | Wt 120.8 lb

## 2022-12-08 DIAGNOSIS — F419 Anxiety disorder, unspecified: Secondary | ICD-10-CM

## 2022-12-08 DIAGNOSIS — E1165 Type 2 diabetes mellitus with hyperglycemia: Secondary | ICD-10-CM

## 2022-12-08 DIAGNOSIS — Z1212 Encounter for screening for malignant neoplasm of rectum: Secondary | ICD-10-CM

## 2022-12-08 DIAGNOSIS — E1122 Type 2 diabetes mellitus with diabetic chronic kidney disease: Secondary | ICD-10-CM | POA: Diagnosis not present

## 2022-12-08 DIAGNOSIS — E1169 Type 2 diabetes mellitus with other specified complication: Secondary | ICD-10-CM | POA: Diagnosis not present

## 2022-12-08 DIAGNOSIS — Z1211 Encounter for screening for malignant neoplasm of colon: Secondary | ICD-10-CM

## 2022-12-08 DIAGNOSIS — I10 Essential (primary) hypertension: Secondary | ICD-10-CM | POA: Diagnosis not present

## 2022-12-08 DIAGNOSIS — E785 Hyperlipidemia, unspecified: Secondary | ICD-10-CM

## 2022-12-08 DIAGNOSIS — E890 Postprocedural hypothyroidism: Secondary | ICD-10-CM

## 2022-12-08 LAB — POCT GLYCOSYLATED HEMOGLOBIN (HGB A1C): Hemoglobin A1C: 8.9 % — AB (ref 4.0–5.6)

## 2022-12-08 LAB — POCT UA - MICROALBUMIN
Creatinine, POC: 50 mg/dL
Microalbumin Ur, POC: 150 mg/L

## 2022-12-08 MED ORDER — OZEMPIC (0.25 OR 0.5 MG/DOSE) 2 MG/3ML ~~LOC~~ SOPN: 0.5000 mg | PEN_INJECTOR | SUBCUTANEOUS | 2 refills | Status: DC

## 2022-12-08 MED ORDER — HYDROXYZINE PAMOATE 25 MG PO CAPS: 25.0000 mg | ORAL_CAPSULE | Freq: Three times a day (TID) | ORAL | 0 refills | Status: DC | PRN

## 2022-12-08 MED ORDER — LEVOTHYROXINE SODIUM 100 MCG PO TABS: 100.0000 ug | ORAL_TABLET | Freq: Every day | ORAL | 0 refills | Status: DC

## 2022-12-08 MED ORDER — LISINOPRIL-HYDROCHLOROTHIAZIDE 20-12.5 MG PO TABS: 1.0000 | ORAL_TABLET | Freq: Every day | ORAL | 1 refills | Status: DC

## 2022-12-08 MED ORDER — METFORMIN HCL 1000 MG PO TABS: 1000.0000 mg | ORAL_TABLET | Freq: Two times a day (BID) | ORAL | 0 refills | Status: DC

## 2022-12-08 NOTE — Progress Notes (Signed)
Established Patient Office Visit  Subjective   Patient ID: Wendy Craig, female    DOB: 29-Mar-1971  Age: 52 y.o. MRN: [id number]  Chief Complaint  Patient presents with   Diabetes   Hyperlipidemia   Hypertension    HPI Wendy Craig is a 52 y.o. female presenting today for follow up of diabetes, hypertension, hyperlipidemia. Diabetes: denies hypoglycemic events, wounds or sores that are not healing well, increased thirst or urination. Denies vision problems, eye exam 2 years ago. Checking glucose at home, ranges have been high 100s to low 200s. Taking metformin 1000 mg daily and Ozempic 0.5 mg about every other week without any side effects. Has been following low-carb diet. Hypertension: Patient here for follow-up of elevated blood pressure. She  is not adherent to low salt diet.   Pt denies chest pain, SOB, dizziness, edema, syncope, fatigue or heart palpitations. Taking lisinopril-hydrochlorothiazide, reports excellent compliance with treatment except for the past 2 weeks when she has been out of her medication. Denies side effects. Hyperlipidemia: Currently managing with low-carb diet. The 10-year ASCVD risk score (Arnett DK, et al., 2019) is: 26.7%  ROS Negative unless otherwise noted in HPI   Objective:     BP (!) 172/113   Pulse 77   Ht '4\' 11"'$  (1.499 m)   Wt 120 lb 12 oz (54.8 kg)   SpO2 100%   BMI 24.39 kg/m   Physical Exam Constitutional:      General: She is not in acute distress.    Appearance: Normal appearance.  HENT:     Head: Normocephalic and atraumatic.  Cardiovascular:     Rate and Rhythm: Normal rate and regular rhythm.     Pulses: Normal pulses.     Heart sounds: No murmur heard.    No friction rub. No gallop.  Pulmonary:     Effort: Pulmonary effort is normal. No respiratory distress.     Breath sounds: No wheezing, rhonchi or rales.  Skin:    General: Skin is warm and dry.  Neurological:     Mental Status: She is alert and oriented to  person, place, and time.    Results for orders placed or performed in visit on 12/08/22  POCT UA - Microalbumin  Result Value Ref Range   Microalbumin Ur, POC 150 mg/L   Creatinine, POC 50 mg/dL   Albumin/Creatinine Ratio, Urine, POC >'300mg'$ /g   POCT HgB A1C  Result Value Ref Range   Hemoglobin A1C 8.9 (A) 4.0 - 5.6 %   HbA1c POC (<> result, manual entry)     HbA1c, POC (prediabetic range)     HbA1c, POC (controlled diabetic range)      Assessment & Plan:  Essential hypertension Assessment & Plan: Blood pressure elevated in the office today likely secondary to being off of blood pressure medication for 2 weeks.  She will come back in 2 weeks for blood pressure check with the nurse to make sure that restarting her medication is adequate for blood pressure control.  Will continue to monitor.  Will collect labs at next follow-up.  Orders: -     Lisinopril-hydroCHLOROthiazide; Take 1 tablet by mouth daily.  Dispense: 90 tablet; Refill: 1  Hyperlipidemia associated with type 2 diabetes mellitus ([place]) Assessment & Plan: Currently managing with diet alone.  LDL 173 at last check on 10/05/2022.  Will recheck lipids at next appointment and may need to reconsider medication therapy.  Previously Wendy Craig recommended statin therapy but patient declined at the time.  Will continue to monitor and revisit the conversation about medication at the next visit.   Type 2 diabetes mellitus with hyperglycemia, without long-term current use of insulin ([facility]) Assessment & Plan: A1c has increased from 7.8-8.9.  Patient will continue to work on diet changes and physical activity.  Has only been taking metformin 1000 mg once daily instead of twice daily as prescribed.  She has agreed to increase frequency to twice daily.  She is still taking Ozempic though she only does an injection about once every 2 weeks instead of weekly.  We discussed that if her A1c goal is not met at less than 7.0 at the next follow-up then  we may need to discuss treatment adjustments.  Patient verbalized understanding.  Encouraged her to continue limiting carbs and routinely monitoring her glucose levels at home.  Orders: -     Ozempic (0.25 or 0.5 MG/DOSE); Inject 0.5 mg into the skin once a week.  Dispense: 3 mL; Refill: 2 -     metFORMIN HCl; Take 1 tablet (1,000 mg total) by mouth 2 (two) times daily with a meal.  Dispense: 180 tablet; Refill: 0 -     POCT UA - Microalbumin -     POCT glycosylated hemoglobin (Hb A1C)  Chronic kidney disease due to type 2 diabetes mellitus ([place]) Assessment & Plan: Severely elevated albumin creatinine ratio greater than 300 mg/g today.  A1c is also much higher than goal at 8.9 today.  She is currently on lisinopril-hydrochlorothiazide.  If blood sugar is not able to get under control at the next visit, then we may need to consider insulin therapy or a referral to endocrinology and possibly nephrology.   Anxiety Assessment & Plan: Stable.  Continue hydroxyzine 25 mg 3 times daily as needed.  Will continue to monitor.  Orders: -     hydrOXYzine Pamoate; Take 1 capsule (25 mg total) by mouth every 8 (eight) hours as needed for anxiety.  Dispense: 30 capsule; Refill: 0  Postablative hypothyroidism Assessment & Plan: Most recent thyroid test showed normal T4 but mildly abnormal T3 and elevated TSH.  Patient did not recheck levels.  Continue levothyroxine 100 mcg, currently asymptomatic.  Will recheck thyroid labs at next visit and adjust medication as necessary.  Orders: -     Levothyroxine Sodium; Take 1 tablet (100 mcg total) by mouth daily.  Dispense: 30 tablet; Refill: 0  Screening for colorectal cancer -     Ambulatory referral to Gastroenterology  Return in 2 weeks for blood pressure check to ensure that restarting blood pressure medicine decreases the increased blood pressure.  Return in about 3 months (around 03/10/2023) for follow-up on diabetes, hypertension, hyperlipidemia.     Velva Harman, PA

## 2022-12-08 NOTE — Assessment & Plan Note (Signed)
Stable.  Continue hydroxyzine 25 mg 3 times daily as needed.  Will continue to monitor.

## 2022-12-08 NOTE — Assessment & Plan Note (Signed)
A1c has increased from 7.8-8.9.  Patient will continue to work on diet changes and physical activity.  Has only been taking metformin 1000 mg once daily instead of twice daily as prescribed.  She has agreed to increase frequency to twice daily.  She is still taking Ozempic though she only does an injection about once every 2 weeks instead of weekly.  We discussed that if her A1c goal is not met at less than 7.0 at the next follow-up then we may need to discuss treatment adjustments.  Patient verbalized understanding.  Encouraged her to continue limiting carbs and routinely monitoring her glucose levels at home.

## 2022-12-08 NOTE — Assessment & Plan Note (Addendum)
Currently managing with diet alone.  LDL 173 at last check on 10/05/2022.  Will recheck lipids at next appointment and may need to reconsider medication therapy.  Previously Maritza recommended statin therapy but patient declined at the time.  Will continue to monitor and revisit the conversation about medication at the next visit.

## 2022-12-08 NOTE — Patient Instructions (Signed)
If the pharmacy does not have Ozempic please let me know and I can send it into a different pharmacy around this area or to a mail order pharmacy to have it shipped to you.

## 2022-12-08 NOTE — Assessment & Plan Note (Addendum)
Severely elevated albumin creatinine ratio greater than 300 mg/g today.  A1c is also much higher than goal at 8.9 today.  She is currently on lisinopril-hydrochlorothiazide.  If blood sugar is not able to get under control at the next visit, then we may need to consider insulin therapy or a referral to endocrinology and possibly nephrology.

## 2022-12-08 NOTE — Assessment & Plan Note (Signed)
Blood pressure elevated in the office today likely secondary to being off of blood pressure medication for 2 weeks.  She will come back in 2 weeks for blood pressure check with the nurse to make sure that restarting her medication is adequate for blood pressure control.  Will continue to monitor.  Will collect labs at next follow-up.

## 2022-12-08 NOTE — Assessment & Plan Note (Signed)
Most recent thyroid test showed normal T4 but mildly abnormal T3 and elevated TSH.  Patient did not recheck levels.  Continue levothyroxine 100 mcg, currently asymptomatic.  Will recheck thyroid labs at next visit and adjust medication as necessary.

## 2022-12-11 NOTE — Telephone Encounter (Signed)
Opened in error, will route to provider to sign because it will not let me sign this encounter.

## 2022-12-22 ENCOUNTER — Ambulatory Visit: Payer: 59 | Admitting: Family Medicine

## 2022-12-22 VITALS — BP 137/85 | Wt 116.8 lb

## 2022-12-22 DIAGNOSIS — I10 Essential (primary) hypertension: Secondary | ICD-10-CM | POA: Diagnosis not present

## 2022-12-22 MED ORDER — LISINOPRIL-HYDROCHLOROTHIAZIDE 20-25 MG PO TABS: 1.0000 | ORAL_TABLET | Freq: Every day | ORAL | 1 refills | Status: DC

## 2022-12-22 NOTE — Progress Notes (Signed)
Pt in office for BP check, provider informed that BP was elevated.

## 2023-02-03 ENCOUNTER — Other Ambulatory Visit: Payer: Self-pay | Admitting: Family Medicine

## 2023-02-03 DIAGNOSIS — E890 Postprocedural hypothyroidism: Secondary | ICD-10-CM

## 2023-03-19 ENCOUNTER — Ambulatory Visit: Payer: 59 | Admitting: Family Medicine

## 2023-03-22 ENCOUNTER — Encounter: Payer: Self-pay | Admitting: Family Medicine

## 2023-03-22 ENCOUNTER — Ambulatory Visit: Payer: 59 | Admitting: Family Medicine

## 2023-03-22 NOTE — Progress Notes (Deleted)
   Established Patient Office Visit  Subjective   Patient ID: Wendy Craig, female    DOB: 1971/08/18  Age: 52 y.o. MRN: [id number]  No chief complaint on file.   HPI YANESSA HOCEVAR is a 52 y.o. female presenting today for follow up of hypertension, hyperlipidemia, diabetes. Hypertension: Patient here for follow-up of elevated blood pressure. She {is/is not:9024} exercising and {is/is not:9024} adherent to low salt diet.   Pt denies chest pain, SOB, dizziness, edema, syncope, fatigue or heart palpitations. Taking ***, reports {excellent/good/fair/poor:19665} compliance with treatment. Denies side effects. Hyperlipidemia: tolerating *** well with no myalgias or significant side effects. Currently consuming a {diet types:17450} diet. {types:19826} The 10-year ASCVD risk score (Arnett DK, et al., 2019) is: 12.7% Diabetes: denies hypoglycemic events, wounds or sores that are not healing well, increased thirst or urination. Denies vision problems, eye exam ***. Checking glucose at home, ranges have been ***. Taking *** as prescribed without any side effects. Has been following *** diet and *** for exercise.  ROS Negative unless otherwise noted in HPI   Objective:     There were no vitals taken for this visit.  Physical Exam   No results found for any visits on 03/22/23.   Assessment & Plan:  There are no diagnoses linked to this encounter.  No follow-ups on file.    Melida Quitter, PA

## 2023-03-26 ENCOUNTER — Ambulatory Visit: Payer: 59 | Admitting: Family Medicine

## 2023-03-26 ENCOUNTER — Encounter: Payer: Self-pay | Admitting: Family Medicine

## 2023-03-26 VITALS — BP 134/85 | HR 72 | Temp 98.0°F | Ht 59.0 in | Wt 119.2 lb

## 2023-03-26 DIAGNOSIS — E1169 Type 2 diabetes mellitus with other specified complication: Secondary | ICD-10-CM

## 2023-03-26 DIAGNOSIS — Z7984 Long term (current) use of oral hypoglycemic drugs: Secondary | ICD-10-CM

## 2023-03-26 DIAGNOSIS — F419 Anxiety disorder, unspecified: Secondary | ICD-10-CM

## 2023-03-26 DIAGNOSIS — I10 Essential (primary) hypertension: Secondary | ICD-10-CM

## 2023-03-26 DIAGNOSIS — E890 Postprocedural hypothyroidism: Secondary | ICD-10-CM

## 2023-03-26 DIAGNOSIS — E785 Hyperlipidemia, unspecified: Secondary | ICD-10-CM

## 2023-03-26 DIAGNOSIS — E1165 Type 2 diabetes mellitus with hyperglycemia: Secondary | ICD-10-CM

## 2023-03-26 DIAGNOSIS — R5383 Other fatigue: Secondary | ICD-10-CM

## 2023-03-26 DIAGNOSIS — E1122 Type 2 diabetes mellitus with diabetic chronic kidney disease: Secondary | ICD-10-CM

## 2023-03-26 DIAGNOSIS — R002 Palpitations: Secondary | ICD-10-CM

## 2023-03-26 LAB — POCT GLYCOSYLATED HEMOGLOBIN (HGB A1C): Hemoglobin A1C: 8.5 % — AB (ref 4.0–5.6)

## 2023-03-26 MED ORDER — METFORMIN HCL 1000 MG PO TABS
1000.0000 mg | ORAL_TABLET | Freq: Two times a day (BID) | ORAL | 0 refills | Status: DC
Start: 2023-03-26 — End: 2023-11-19

## 2023-03-26 MED ORDER — OZEMPIC (0.25 OR 0.5 MG/DOSE) 2 MG/3ML ~~LOC~~ SOPN
0.5000 mg | PEN_INJECTOR | SUBCUTANEOUS | 2 refills | Status: DC
Start: 2023-03-26 — End: 2024-02-22

## 2023-03-26 MED ORDER — LISINOPRIL-HYDROCHLOROTHIAZIDE 20-25 MG PO TABS
1.0000 | ORAL_TABLET | Freq: Every day | ORAL | 1 refills | Status: DC
Start: 2023-03-26 — End: 2024-03-04

## 2023-03-26 MED ORDER — LEVOTHYROXINE SODIUM 100 MCG PO TABS
100.0000 ug | ORAL_TABLET | Freq: Every day | ORAL | 0 refills | Status: DC
Start: 2023-03-26 — End: 2023-04-09

## 2023-03-26 NOTE — Progress Notes (Signed)
Established Patient Office Visit  Subjective   Patient ID: Wendy Craig, female    DOB: 1971-03-13  Age: 52 y.o. MRN: [id number]  Chief Complaint  Patient presents with   Diabetes    HPI CHIZITEREM KITTNER is a 52 y.o. female presenting today for follow up of hypertension, hyperlipidemia, diabetes.  She also endorses increasing fatigue, anxiety, occasional palpitations and would like all of her hormones checked not just thyroid hormones.  She has had a hysterectomy and thus is unsure of whether she is officially postmenopausal. Hypertension: Patient here for follow-up of elevated blood pressure.  Pt denies chest pain, SOB, dizziness, edema, syncope, fatigue or heart palpitations. Taking lisinopril-HCTZ nightly, reports excellent compliance with treatment. Denies side effects. Hyperlipidemia: Currently consuming a general diet.  The 10-year ASCVD risk score (Arnett DK, et al., 2019) is: 11.8% Diabetes: denies hypoglycemic events, wounds or sores that are not healing well, increased thirst or urination. Denies vision problems, eye exam due. Taking 1000 mg once daily, only using Ozempic every couple of weeks.  Using medications without any side effects.   ROS Negative unless otherwise noted in HPI   Objective:     BP 134/85   Pulse 72   Temp 98 F (36.7 C) (Oral)   Ht 4\' 11"  (1.499 m)   Wt 119 lb 4 oz (54.1 kg)   SpO2 100%   BMI 24.09 kg/m   Physical Exam Constitutional:      General: She is not in acute distress.    Appearance: Normal appearance.  HENT:     Head: Normocephalic and atraumatic.  Cardiovascular:     Rate and Rhythm: Normal rate and regular rhythm.     Heart sounds: No murmur heard.    No friction rub. No gallop.  Pulmonary:     Effort: Pulmonary effort is normal. No respiratory distress.     Breath sounds: No wheezing, rhonchi or rales.  Skin:    General: Skin is warm and dry.  Neurological:     Mental Status: She is alert and oriented to person,  place, and time.    Results for orders placed or performed in visit on 03/26/23  POCT HgB A1C  Result Value Ref Range   Hemoglobin A1C 8.5 (A) 4.0 - 5.6 %   HbA1c POC (<> result, manual entry)     HbA1c, POC (prediabetic range)     HbA1c, POC (controlled diabetic range)       Assessment & Plan:  Essential hypertension Assessment & Plan: BP goal <130/80.  Blood pressure elevated initially 158/110, on repeat 134/85.  She endorses that she started working after getting up at around 6 AM this morning which she knows is stressful.  We discussed switching her blood pressure medication to a morning dose to get its full effect during the day when she has other stressors.  Encouraged ambulatory blood pressure monitoring, will reassess at next appointment.  Orders: -     CBC with Differential/Platelet; Future -     Comprehensive metabolic panel; Future -     Lisinopril-hydroCHLOROthiazide; Take 1 tablet by mouth daily.  Dispense: 90 tablet; Refill: 1  Hyperlipidemia associated with type 2 diabetes mellitus (HCC) Assessment & Plan: Last lipid panel: LDL 173, HDL 73, triglycerides 76 on 10/05/2022.  Repeating lipid panel today.  We discussed medication therapy may be necessary to reduce risk of cardiovascular events particularly given her other risk factors like diabetes.  Orders: -     Lipid panel; Future  Type 2 diabetes mellitus with hyperglycemia, without long-term current use of insulin (HCC) Assessment & Plan: A1c decreased slightly from 8.9 to 8.5.  She has been taking metformin 1000 mg once daily rather than twice daily as prescription indicates.  She also admits that she is not taking Ozempic every single week, she only takes it about once every 2 weeks.  We discussed that having high blood sugar puts her at a much higher risk of complications including worsening her kidney disease.  We discussed options including increasing metformin to twice daily, increasing Ozempic dosage but not  frequency, or taking Ozempic weekly as prescribed.  Patient has opted for consistently taking Ozempic 0.5 mg weekly and continuing metformin 1000 mg once daily.  We discussed that if blood sugar goals remain above goal of being less than 7.0, we will need to increase her Ozempic dose.  We may also consider a referral to endocrinology.  Orders: -     POCT glycosylated hemoglobin (Hb A1C) -     metFORMIN HCl; Take 1 tablet (1,000 mg total) by mouth 2 (two) times daily with a meal.  Dispense: 180 tablet; Refill: 0 -     Ozempic (0.25 or 0.5 MG/DOSE); Inject 0.5 mg into the skin once a week.  Dispense: 3 mL; Refill: 2  Postablative hypothyroidism Assessment & Plan: Rechecking thyroid levels today.  Continue levothyroxine 100 mcg daily unless lab results warrant change.  Orders: -     Levothyroxine Sodium; Take 1 tablet (100 mcg total) by mouth daily.  Dispense: 60 tablet; Refill: 0  Chronic kidney disease due to type 2 diabetes mellitus (HCC) Assessment & Plan: On last CMP, creatinine slightly elevated at 1.07 and EGFR decreased slightly from 68 to 62.  We discussed the importance of medication compliance for blood pressure and blood sugar in order to kidneys.  And next appointment if still noncompliant, refer to endocrinology and possibly nephrology.  Orders: -     CBC with Differential/Platelet; Future -     Comprehensive metabolic panel; Future  Anxiety -     Hormone Panel  Intermittent palpitations -     Hormone Panel  Fatigue, unspecified type -     Hormone Panel    Return in about 3 months (around 06/26/2023) for follow-up for HTN, HLD, DM, fasting blood work 1 week before.    Melida Quitter, PA

## 2023-03-26 NOTE — Assessment & Plan Note (Signed)
BP goal <130/80.  Blood pressure elevated initially 158/110, on repeat 134/85.  She endorses that she started working after getting up at around 6 AM this morning which she knows is stressful.  We discussed switching her blood pressure medication to a morning dose to get its full effect during the day when she has other stressors.  Encouraged ambulatory blood pressure monitoring, will reassess at next appointment.

## 2023-03-26 NOTE — Assessment & Plan Note (Signed)
Last lipid panel: LDL 173, HDL 73, triglycerides 76 on 10/05/2022.  Repeating lipid panel today.  We discussed medication therapy may be necessary to reduce risk of cardiovascular events particularly given her other risk factors like diabetes.

## 2023-03-26 NOTE — Assessment & Plan Note (Signed)
Rechecking thyroid levels today.  Continue levothyroxine 100 mcg daily unless lab results warrant change.

## 2023-03-26 NOTE — Assessment & Plan Note (Signed)
A1c decreased slightly from 8.9 to 8.5.  She has been taking metformin 1000 mg once daily rather than twice daily as prescription indicates.  She also admits that she is not taking Ozempic every single week, she only takes it about once every 2 weeks.  We discussed that having high blood sugar puts her at a much higher risk of complications including worsening her kidney disease.  We discussed options including increasing metformin to twice daily, increasing Ozempic dosage but not frequency, or taking Ozempic weekly as prescribed.  Patient has opted for consistently taking Ozempic 0.5 mg weekly and continuing metformin 1000 mg once daily.  We discussed that if blood sugar goals remain above goal of being less than 7.0, we will need to increase her Ozempic dose.  We may also consider a referral to endocrinology.

## 2023-03-26 NOTE — Assessment & Plan Note (Signed)
On last CMP, creatinine slightly elevated at 1.07 and EGFR decreased slightly from 68 to 62.  We discussed the importance of medication compliance for blood pressure and blood sugar in order to kidneys.  And next appointment if still noncompliant, refer to endocrinology and possibly nephrology.

## 2023-03-26 NOTE — Patient Instructions (Addendum)
Take your Ozempic every single week so we can get your A1C to the goal of being less than 7.0 :) Continue metformin 1000 mg once daily. You are due for your eye exam, so once you have that done we will request a copy.  Check your blood pressure at home a few times each week. Try switching your blood pressure medicine to the morning for better control during the day.

## 2023-04-02 LAB — HORMONE PANEL (T4,TSH,FSH,TESTT,SHBG,DHEA,ETC): Progesterone, Serum: <10 ng/dL

## 2023-04-03 LAB — HORMONE PANEL (T4,TSH,FSH,TESTT,SHBG,DHEA,ETC)
Estradiol, Serum, MS: 2.6 pg/mL
Triiodothyronine (T-3), Serum: 60 ng/dL

## 2023-04-04 LAB — HORMONE PANEL (T4,TSH,FSH,TESTT,SHBG,DHEA,ETC)
DHEA-Sulfate, LCMS: 70 ug/dL
Free T-3: 2.1 pg/mL

## 2023-04-05 LAB — HORMONE PANEL (T4,TSH,FSH,TESTT,SHBG,DHEA,ETC)
Estrone Sulfate: 28 ng/dL
TSH: 8.5 uU/mL — ABNORMAL HIGH

## 2023-04-06 LAB — HORMONE PANEL (T4,TSH,FSH,TESTT,SHBG,DHEA,ETC)
Free Testosterone, Serum: 2 pg/mL
Sex Hormone Binding Globulin: 69.3 nmol/L
T4: 4.9 ug/dL
Testosterone, Serum (Total): 18 ng/dL
Testosterone-% Free: 1.1 %

## 2023-04-09 ENCOUNTER — Other Ambulatory Visit: Payer: Self-pay | Admitting: Family Medicine

## 2023-04-09 DIAGNOSIS — E890 Postprocedural hypothyroidism: Secondary | ICD-10-CM

## 2023-04-09 DIAGNOSIS — R946 Abnormal results of thyroid function studies: Secondary | ICD-10-CM

## 2023-04-09 MED ORDER — LEVOTHYROXINE SODIUM 112 MCG PO TABS
112.0000 ug | ORAL_TABLET | Freq: Every day | ORAL | 1 refills | Status: DC
Start: 2023-04-09 — End: 2023-04-13

## 2023-04-13 MED ORDER — LEVOTHYROXINE SODIUM 125 MCG PO TABS
125.0000 ug | ORAL_TABLET | Freq: Every day | ORAL | 3 refills | Status: DC
Start: 2023-04-13 — End: 2024-03-18

## 2023-04-13 NOTE — Addendum Note (Signed)
Addended by: Saralyn Pilar on: 04/13/2023 11:31 AM   Modules accepted: Orders

## 2023-04-16 ENCOUNTER — Other Ambulatory Visit: Payer: Self-pay | Admitting: Family Medicine

## 2023-05-05 ENCOUNTER — Other Ambulatory Visit: Payer: Self-pay | Admitting: Family Medicine

## 2023-05-05 DIAGNOSIS — E890 Postprocedural hypothyroidism: Secondary | ICD-10-CM

## 2023-11-15 ENCOUNTER — Encounter: Payer: Self-pay | Admitting: Family Medicine

## 2023-11-17 ENCOUNTER — Other Ambulatory Visit: Payer: Self-pay | Admitting: Family Medicine

## 2023-11-17 DIAGNOSIS — E1165 Type 2 diabetes mellitus with hyperglycemia: Secondary | ICD-10-CM

## 2024-02-22 ENCOUNTER — Other Ambulatory Visit: Payer: Self-pay | Admitting: Family Medicine

## 2024-02-22 ENCOUNTER — Telehealth: Payer: Self-pay | Admitting: *Deleted

## 2024-02-22 DIAGNOSIS — E1165 Type 2 diabetes mellitus with hyperglycemia: Secondary | ICD-10-CM

## 2024-02-22 MED ORDER — OZEMPIC (0.25 OR 0.5 MG/DOSE) 2 MG/3ML ~~LOC~~ SOPN
0.5000 mg | PEN_INJECTOR | SUBCUTANEOUS | 2 refills | Status: DC
Start: 2024-02-22 — End: 2024-03-18

## 2024-02-22 NOTE — Telephone Encounter (Signed)
 Copied from CRM [id number]. Topic: Clinical - Medication Question >> Feb 22, 2024  8:41 AM Wendy Craig wrote: Reason for CRM: The patient has called to check on the status of the prescription for their OZEMPIC , 0.25 OR 0.5 MG/DOSE, 2 MG/3ML SOPN [[id number]]  Please contact the patient further if/when possible

## 2024-02-29 ENCOUNTER — Other Ambulatory Visit: Payer: Self-pay | Admitting: Family Medicine

## 2024-02-29 DIAGNOSIS — I10 Essential (primary) hypertension: Secondary | ICD-10-CM

## 2024-02-29 NOTE — Telephone Encounter (Signed)
 Copied from CRM [id number]. Topic: Clinical - Medication Refill >> Feb 29, 2024  4:52 PM Leory Rands wrote: Medication:  lisinopril -hydrochlorothiazide  (ZESTORETIC ) 20-25 MG tablet [[id number]]   Has the patient contacted their pharmacy? Yes (Agent: If no, request that the patient contact the pharmacy for the refill. If patient does not wish to contact the pharmacy document the reason why and proceed with request.) (Agent: If yes, when and what did the pharmacy advise?)  This is the patient's preferred pharmacy:  [facility] - [place], [place] - [address] [place] [address] Phone: [phone] Fax: [phone]  Is this the correct pharmacy for this prescription? Yes If no, delete pharmacy and type the correct one.   Has the prescription been filled recently? Yes  Is the patient out of the medication? Yes Patient took last pill today  Has the patient been seen for an appointment in the last year OR does the patient have an upcoming appointment? Yes  Can we respond through MyChart? Yes  Agent: Please be advised that Rx refills may take up to 3 business days. We ask that you follow-up with your pharmacy.

## 2024-03-04 NOTE — Telephone Encounter (Signed)
 Refill sent.

## 2024-03-10 ENCOUNTER — Other Ambulatory Visit: Payer: Self-pay | Admitting: *Deleted

## 2024-03-10 DIAGNOSIS — I10 Essential (primary) hypertension: Secondary | ICD-10-CM

## 2024-03-10 DIAGNOSIS — E1169 Type 2 diabetes mellitus with other specified complication: Secondary | ICD-10-CM

## 2024-03-10 DIAGNOSIS — E1165 Type 2 diabetes mellitus with hyperglycemia: Secondary | ICD-10-CM

## 2024-03-11 ENCOUNTER — Other Ambulatory Visit

## 2024-03-11 DIAGNOSIS — E1165 Type 2 diabetes mellitus with hyperglycemia: Secondary | ICD-10-CM

## 2024-03-11 DIAGNOSIS — E1169 Type 2 diabetes mellitus with other specified complication: Secondary | ICD-10-CM

## 2024-03-11 DIAGNOSIS — I10 Essential (primary) hypertension: Secondary | ICD-10-CM

## 2024-03-12 ENCOUNTER — Ambulatory Visit: Payer: Self-pay

## 2024-03-12 LAB — CBC WITH DIFFERENTIAL/PLATELET
Basophils Absolute: 0.1 10*3/uL (ref 0.0–0.2)
Basos: 1 %
EOS (ABSOLUTE): 0.1 10*3/uL (ref 0.0–0.4)
Eos: 1 %
Hematocrit: 36.4 % (ref 34.0–46.6)
Hemoglobin: 11.9 g/dL (ref 11.1–15.9)
Immature Grans (Abs): 0 10*3/uL (ref 0.0–0.1)
Immature Granulocytes: 0 %
Lymphocytes Absolute: 1.9 10*3/uL (ref 0.7–3.1)
Lymphs: 21 %
MCH: 29.6 pg (ref 26.6–33.0)
MCHC: 32.7 g/dL (ref 31.5–35.7)
MCV: 91 fL (ref 79–97)
Monocytes Absolute: 0.7 10*3/uL (ref 0.1–0.9)
Monocytes: 7 %
Neutrophils Absolute: 6.4 10*3/uL (ref 1.4–7.0)
Neutrophils: 70 %
Platelets: 344 10*3/uL (ref 150–450)
RBC: 4.02 x10E6/uL (ref 3.77–5.28)
RDW: 12.5 % (ref 11.7–15.4)
WBC: 9.2 10*3/uL (ref 3.4–10.8)

## 2024-03-12 LAB — MICROALBUMIN / CREATININE URINE RATIO
Creatinine, Urine: 75.6 mg/dL
Microalb/Creat Ratio: 60 mg/g{creat} — ABNORMAL HIGH (ref 0–29)
Microalbumin, Urine: 45.4 ug/mL

## 2024-03-12 LAB — COMPREHENSIVE METABOLIC PANEL WITH GFR
ALT: 13 IU/L (ref 0–32)
AST: 20 IU/L (ref 0–40)
Albumin: 4.5 g/dL (ref 3.8–4.9)
Alkaline Phosphatase: 63 IU/L (ref 44–121)
BUN/Creatinine Ratio: 16 (ref 9–23)
BUN: 19 mg/dL (ref 6–24)
Bilirubin Total: 0.3 mg/dL (ref 0.0–1.2)
CO2: 21 mmol/L (ref 20–29)
Calcium: 10 mg/dL (ref 8.7–10.2)
Chloride: 98 mmol/L (ref 96–106)
Creatinine, Ser: 1.19 mg/dL — ABNORMAL HIGH (ref 0.57–1.00)
Globulin, Total: 3.1 g/dL (ref 1.5–4.5)
Glucose: 237 mg/dL — ABNORMAL HIGH (ref 70–99)
Potassium: 4.3 mmol/L (ref 3.5–5.2)
Sodium: 136 mmol/L (ref 134–144)
Total Protein: 7.6 g/dL (ref 6.0–8.5)
eGFR: 54 mL/min/{1.73_m2} — ABNORMAL LOW (ref >59)

## 2024-03-12 LAB — LIPID PANEL
Chol/HDL Ratio: 3.3 ratio (ref 0.0–4.4)
Cholesterol, Total: 257 mg/dL — ABNORMAL HIGH (ref 100–199)
HDL: 78 mg/dL (ref >39)
LDL Chol Calc (NIH): 165 mg/dL — ABNORMAL HIGH (ref 0–99)
Triglycerides: 83 mg/dL (ref 0–149)
VLDL Cholesterol Cal: 14 mg/dL (ref 5–40)

## 2024-03-12 LAB — TSH: TSH: 6.07 u[IU]/mL — ABNORMAL HIGH (ref 0.450–4.500)

## 2024-03-12 LAB — HEMOGLOBIN A1C
Est. average glucose Bld gHb Est-mCnc: 255 mg/dL
Hgb A1c MFr Bld: 10.5 % — ABNORMAL HIGH (ref 4.8–5.6)

## 2024-03-18 ENCOUNTER — Ambulatory Visit

## 2024-03-18 VITALS — BP 129/86 | HR 72 | Ht 59.0 in | Wt 120.5 lb

## 2024-03-18 DIAGNOSIS — E1169 Type 2 diabetes mellitus with other specified complication: Secondary | ICD-10-CM

## 2024-03-18 DIAGNOSIS — F419 Anxiety disorder, unspecified: Secondary | ICD-10-CM

## 2024-03-18 DIAGNOSIS — I10 Essential (primary) hypertension: Secondary | ICD-10-CM

## 2024-03-18 DIAGNOSIS — E1165 Type 2 diabetes mellitus with hyperglycemia: Secondary | ICD-10-CM

## 2024-03-18 DIAGNOSIS — E890 Postprocedural hypothyroidism: Secondary | ICD-10-CM

## 2024-03-18 DIAGNOSIS — Z7984 Long term (current) use of oral hypoglycemic drugs: Secondary | ICD-10-CM

## 2024-03-18 DIAGNOSIS — E1122 Type 2 diabetes mellitus with diabetic chronic kidney disease: Secondary | ICD-10-CM

## 2024-03-18 DIAGNOSIS — E785 Hyperlipidemia, unspecified: Secondary | ICD-10-CM

## 2024-03-18 MED ORDER — HYDROXYZINE PAMOATE 25 MG PO CAPS: 25.0000 mg | ORAL_CAPSULE | Freq: Three times a day (TID) | ORAL | 0 refills | Status: DC | PRN

## 2024-03-18 MED ORDER — LEVOTHYROXINE SODIUM 125 MCG PO TABS
125.0000 ug | ORAL_TABLET | Freq: Every day | ORAL | 3 refills | Status: AC
Start: 2024-03-18 — End: ?

## 2024-03-18 MED ORDER — EMPAGLIFLOZIN 10 MG PO TABS: 10.0000 mg | ORAL_TABLET | Freq: Every day | ORAL | 0 refills | Status: DC

## 2024-03-18 NOTE — Assessment & Plan Note (Signed)
 On last CMP, creatinine slightly more elevated at 1.19 and EGFR 54.  We discussed the importance of medication compliance for blood pressure and blood sugar to protect her kidneys.  Replaced Ozempic  with Jardiance 10 mg x 1 month then 25 mg thereafter for A1c control and kidney protection.  At next appointment, if still noncompliant and/or labs have not improved, will refer to endocrinology and possibly nephrology.

## 2024-03-18 NOTE — Assessment & Plan Note (Signed)
 Last lipid panel: LDL 165,  HDL 78, triglycerides 83.  Stable.  We discussed today that medication therapy (statin) may still be necessary to reduce the risk of cardiovascular events even in the absence of elevated cholesterol due to her other risk factors such as diabetes/hypertension.  Checking lipid panel in 3 months.  If A1c still not under control at next appointment, will initiate statin therapy.

## 2024-03-18 NOTE — Patient Instructions (Addendum)
 It was nice to see you today!  As we discussed in clinic:  -Start taking your Metformin  1000 mg twice per day, every day, as prescribed for the next 3 months to help control your A1c. -I have discontinued the Ozempic  and sent in Jardiance 10 mg which will help lower the A1c and offer good kidney protection. Please take this medication every day for 30 days, then the dose will increase to 25 mg.  -Please try to check your blood sugar 3-4 times per week so we can get an idea of what your blood sugars are running. -Continue lisinopril -hydrochlorothiazide  for BP.  I will plan to see you back in 3 months to follow up on A1c and see if these medication changes have improved your levels.  If you have any problems before your next visit feel free to message me via MyChart (minor issues or questions) or call the office, otherwise you may reach out to schedule an office visit.  Thank you! Meryl Acosta, PA-C

## 2024-03-18 NOTE — Progress Notes (Signed)
 Established Patient Office Visit  Subjective   Patient ID: Wendy Craig, female    DOB: [birth year]  Age: 53 y.o. MRN: [id number]  Chief Complaint  Patient presents with   Medical Management of Chronic Issues    HPI  Wendy Craig is a 53 year old female who presents to the clinic today for follow-up on hypertension, hyperlipidemia, diabetes.  Hypertension: denies chest pain, SOB, dizziness, edema, syncope, fatigue or heart palpitations. Taking lisinopril -HCTZ nightly, reports excellent compliance with treatment. Denies side effects.   Hyperlipidemia: The 10-year ASCVD risk score (Arnett DK, et al., 2019) is: 10.6%.  Not currently on a statin.  It was discussed at her last visit in June 2024 that depending on her lipid panel results, it would be highly recommended to initiate a statin, however further discussion did not occur at that time.   Diabetes: On metformin  1000 mg twice daily and Ozempic  for A1c control.  Patient reports that over the last 6 months she has only been taking her metformin  on an as needed basis-reporting that she would only take it if she checked her blood sugar and it was high at the time.  Patient also reports that she has not been taking the Ozempic  because of the weight loss side effect and she would not like to lose any more weight.  Denies polydipsia, polyuria, new wounds or sores that are not healing.  At her visit with Britta Candy 6 months ago, it was discussed that a referral to endocrinology may be considered if A1c continues to increase and compliance with medications is still an issue.  We discussed that having high blood sugar puts her at a much higher risk of complications including worsening her pre-existing kidney disease.  Patient reports that she really wants to get her A1c under control through taking her prescribed medications daily and watching what she eats.  She does reports that over the last 3 weeks she has cut out all fried foods and is limiting her  carb intake.  She also reports that over the last 3 weeks she has been consistently taking her metformin  as prescribed every day.     Hypothyroidism: Patient currently on 125 mcg of levothyroxine  daily.  She reports excellent compliance with this medication and reports that she takes it before breakfast away from other foods/medications/vitamins.  She denies symptoms of fatigue, dry skin, hair loss, weight gain.    ROS Per HPI.    Objective:     BP 129/86   Pulse 72   Ht 4\' 11"  (1.499 m)   Wt 120 lb 8 oz (54.7 kg)   SpO2 100%   BMI 24.34 kg/m    Physical Exam Constitutional:      General: She is not in acute distress.    Appearance: Normal appearance.  HENT:     Mouth/Throat:     Mouth: Mucous membranes are moist.     Pharynx: No posterior oropharyngeal erythema.  Eyes:     Pupils: Pupils are equal, round, and reactive to light.  Cardiovascular:     Rate and Rhythm: Normal rate and regular rhythm.     Heart sounds: Normal heart sounds. No murmur heard.    No friction rub. No gallop.  Pulmonary:     Effort: Pulmonary effort is normal. No respiratory distress.     Breath sounds: Normal breath sounds.  Musculoskeletal:        General: No swelling.     Cervical back: Neck supple.  Lymphadenopathy:  Cervical: No cervical adenopathy.  Skin:    General: Skin is warm and dry.  Neurological:     General: No focal deficit present.     Mental Status: She is alert.  Psychiatric:        Mood and Affect: Mood normal.        Behavior: Behavior normal.        Thought Content: Thought content normal.     No results found for any visits on 03/18/24.  Last CBC Lab Results  Component Value Date   WBC 9.2 03/11/2024   HGB 11.9 03/11/2024   HCT 36.4 03/11/2024   MCV 91 03/11/2024   MCH 29.6 03/11/2024   RDW 12.5 03/11/2024   PLT 344 03/11/2024   Last metabolic panel Lab Results  Component Value Date   GLUCOSE 237 (H) 03/11/2024   NA 136 03/11/2024   K 4.3  03/11/2024   CL 98 03/11/2024   CO2 21 03/11/2024   BUN 19 03/11/2024   CREATININE 1.19 (H) 03/11/2024   EGFR 54 (L) 03/11/2024   CALCIUM  10.0 03/11/2024   PROT 7.6 03/11/2024   ALBUMIN 4.5 03/11/2024   LABGLOB 3.1 03/11/2024   AGRATIO 1.5 10/05/2022   BILITOT 0.3 03/11/2024   ALKPHOS 63 03/11/2024   AST 20 03/11/2024   ALT 13 03/11/2024   ANIONGAP 2 (L) 06/25/2020   Last lipids Lab Results  Component Value Date   CHOL 257 (H) 03/11/2024   HDL 78 03/11/2024   LDLCALC 165 (H) 03/11/2024   LDLDIRECT 164 (H) 09/16/2013   TRIG 83 03/11/2024   CHOLHDL 3.3 03/11/2024   Last hemoglobin A1c Lab Results  Component Value Date   HGBA1C 10.5 (H) 03/11/2024   Last thyroid  functions Lab Results  Component Value Date   TSH 6.070 (H) 03/11/2024   T3TOTAL 65 (L) 08/24/2022   T4TOTAL 8.2 11/10/2014      The 10-year ASCVD risk score (Arnett DK, et al., 2019) is: 10.6%    Assessment & Plan:   Essential hypertension Assessment & Plan: BP goal <130/80.  BP above goal on initial check, very close to goal on recheck at 129/86.  Continue lisinopril -HCTZ 20-25 mg tablet daily for blood pressure.  Discussed with patient that the lisinopril  is also offering kidney protection as well as controlling her blood pressure.  Encourage ambulatory blood pressure monitoring, will reassess at next appointment in 3 months.   Anxiety -     hydrOXYzine  Pamoate; Take 1 capsule (25 mg total) by mouth every 8 (eight) hours as needed for anxiety.  Dispense: 30 capsule; Refill: 0  Postablative hypothyroidism -     Levothyroxine  Sodium; Take 1 tablet (125 mcg total) by mouth daily.  Dispense: 90 tablet; Refill: 3  Type 2 diabetes mellitus with hyperglycemia, without long-term current use of insulin  (HCC) Assessment & Plan: Most recent A1c increased from 8.5-10.5.  This increase is likely due to inconsistent adherence to medications.  Had a discussion with patient about the importance of taking her  metformin  daily as prescribed instead of on an as-needed basis depending on her blood sugar levels.  Discussed with patient that metformin  is different from insulin  and that it is a medication that needs to be taken daily despite what her blood sugar readings are that day.  We discontinued her Ozempic  today due to undesired weight loss as a side effect.  Will start Jardiance 10 mg daily x 1 month then increase to 25 mg daily thereafter.  Discussed that Jardiance would  be a good fit for her not only for A1c control but for further kidney protection given her pre-existing diabetic kidney disease.  Also encouraged patient to participate in ambulatory blood glucose monitoring at least 3 days a week and bring the readings with her to her next visit so we can get an idea of what her blood sugars are running at home.  Strongly advised patient to take both her metformin  and Jardiance daily as prescribed over the next 3 months.  Until her follow-up so that we can improve her A1c and further protect her kidneys.  Will follow-up in 3 months and recheck A1c.   Chronic kidney disease due to type 2 diabetes mellitus (HCC) Assessment & Plan: On last CMP, creatinine slightly more elevated at 1.19 and EGFR 54.  We discussed the importance of medication compliance for blood pressure and blood sugar to protect her kidneys.  Replaced Ozempic  with Jardiance 10 mg x 1 month then 25 mg thereafter for A1c control and kidney protection.  At next appointment, if still noncompliant and/or labs have not improved, will refer to endocrinology and possibly nephrology.   Hyperlipidemia associated with type 2 diabetes mellitus (HCC) Assessment & Plan: Last lipid panel: LDL 165,  HDL 78, triglycerides 83.  Stable.  We discussed today that medication therapy (statin) may still be necessary to reduce the risk of cardiovascular events even in the absence of elevated cholesterol due to her other risk factors such as diabetes/hypertension.   Checking lipid panel in 3 months.  If A1c still not under control at next appointment, will initiate statin therapy.   Other orders -     Empagliflozin; Take 1 tablet (10 mg total) by mouth daily before breakfast.  Dispense: 30 tablet; Refill: 0     Return in about 3 months (around 06/18/2024) for HTN, HLD, DM.    Odilia Bennett, PA-C

## 2024-03-18 NOTE — Assessment & Plan Note (Signed)
 BP goal <130/80.  BP above goal on initial check, very close to goal on recheck at 129/86.  Continue lisinopril -HCTZ 20-25 mg tablet daily for blood pressure.  Discussed with patient that the lisinopril  is also offering kidney protection as well as controlling her blood pressure.  Encourage ambulatory blood pressure monitoring, will reassess at next appointment in 3 months.

## 2024-03-18 NOTE — Assessment & Plan Note (Signed)
 Most recent A1c increased from 8.5-10.5.  This increase is likely due to inconsistent adherence to medications.  Had a discussion with patient about the importance of taking her metformin  daily as prescribed instead of on an as-needed basis depending on her blood sugar levels.  Discussed with patient that metformin  is different from insulin  and that it is a medication that needs to be taken daily despite what her blood sugar readings are that day.  We discontinued her Ozempic  today due to undesired weight loss as a side effect.  Will start Jardiance 10 mg daily x 1 month then increase to 25 mg daily thereafter.  Discussed that Evone Hoh would be a good fit for her not only for A1c control but for further kidney protection given her pre-existing diabetic kidney disease.  Also encouraged patient to participate in ambulatory blood glucose monitoring at least 3 days a week and bring the readings with her to her next visit so we can get an idea of what her blood sugars are running at home.  Strongly advised patient to take both her metformin  and Jardiance daily as prescribed over the next 3 months.  Until her follow-up so that we can improve her A1c and further protect her kidneys.  Will follow-up in 3 months and recheck A1c.

## 2024-09-08 ENCOUNTER — Encounter: Payer: Self-pay | Admitting: Family Medicine

## 2024-09-08 ENCOUNTER — Ambulatory Visit: Admitting: Family Medicine

## 2024-09-08 VITALS — BP 175/108 | HR 71 | Ht 59.0 in | Wt 121.0 lb

## 2024-09-08 DIAGNOSIS — F419 Anxiety disorder, unspecified: Secondary | ICD-10-CM

## 2024-09-08 DIAGNOSIS — Z1231 Encounter for screening mammogram for malignant neoplasm of breast: Secondary | ICD-10-CM

## 2024-09-08 DIAGNOSIS — Z1211 Encounter for screening for malignant neoplasm of colon: Secondary | ICD-10-CM | POA: Diagnosis not present

## 2024-09-08 DIAGNOSIS — E1165 Type 2 diabetes mellitus with hyperglycemia: Secondary | ICD-10-CM

## 2024-09-08 DIAGNOSIS — I10 Essential (primary) hypertension: Secondary | ICD-10-CM

## 2024-09-08 DIAGNOSIS — Z1212 Encounter for screening for malignant neoplasm of rectum: Secondary | ICD-10-CM

## 2024-09-08 DIAGNOSIS — Z0000 Encounter for general adult medical examination without abnormal findings: Secondary | ICD-10-CM | POA: Insufficient documentation

## 2024-09-08 LAB — POCT GLYCOSYLATED HEMOGLOBIN (HGB A1C): HbA1c POC (<> result, manual entry): 8.2 % (ref 4.0–5.6)

## 2024-09-08 MED ORDER — EMPAGLIFLOZIN 10 MG PO TABS: 10.0000 mg | ORAL_TABLET | Freq: Every day | ORAL | 0 refills | Status: DC

## 2024-09-08 MED ORDER — SYNJARDY 12.5-1000 MG PO TABS: 1.0000 | ORAL_TABLET | Freq: Two times a day (BID) | ORAL | 3 refills | Status: AC

## 2024-09-08 MED ORDER — HYDROXYZINE PAMOATE 25 MG PO CAPS: 25.0000 mg | ORAL_CAPSULE | Freq: Three times a day (TID) | ORAL | 0 refills | Status: AC | PRN

## 2024-09-08 MED ORDER — OLMESARTAN-AMLODIPINE-HCTZ 20-5-12.5 MG PO TABS: 1.0000 | ORAL_TABLET | Freq: Every day | ORAL | 3 refills | Status: AC

## 2024-09-08 NOTE — Assessment & Plan Note (Signed)
 Home BP readings are elevated, with a reading of 140/98 mmHg this morning. Current medication is lisinopril -HCTZ. - Discontinue lisinopril /hydrochlorothiazide . - Start olmesartan/amlodipine/hydrochlorothiazide  combination pill. Will start with a lower dose of HCTZ (12.5mg ) within the combination pill. - Will send 90-day supply with refills.

## 2024-09-08 NOTE — Assessment & Plan Note (Signed)
 A1c was 8.2 six months ago. Reports inconsistent medication adherence with metformin  and has not taken Jardiance  for several months. Creatinine is noted to be rising, increasing concern for diabetic nephropathy. - Switch from metformin  to Synjardy (metformin /empagliflozin ). Will send 90-day supply with refills. If co-pay is an issue, will switch back to separate metformin  and Jardiance . - Labs today to check CMP to re-evaluate renal function.

## 2024-09-08 NOTE — Patient Instructions (Signed)
 It was nice to see you today,  We addressed the following topics today: - I am changing your blood pressure medication to a combination pill called olmesartan/amlodipine/hydrochlorothiazide . Please stop taking your lisinopril /hydrochlorothiazide  and start this new one. - I am also changing your diabetes medication to a combination pill called Synjardy. This contains metformin  and Jardiance . Please stop taking your separate metformin  pills. - Please continue to monitor your blood pressure at home. I am giving you a log sheet to write down your blood pressure and blood sugar readings. - Please bring the log sheet back to the office in two weeks for the nurse to review. - Please get your labs drawn today before you leave. We need to check your kidney function. - Do not stop taking your new medications if you run out. Please contact us  for a refill. - We will see you back in the office in 3 months to check your blood sugar. - someone will call you to schedule the colonoscopy and the mammogram  Have a great day,  Rolan Slain, MD

## 2024-09-08 NOTE — Progress Notes (Signed)
 Established Patient Office Visit  Subjective   Patient ID: Wendy Craig, female    DOB: October 27, 1970  Age: 53 y.o. MRN: [id number]  Chief Complaint  Patient presents with   Medical Management of Chronic Issues    HPI  Subjective - Follow up for diabetes and hypertension. Last seen in June.  - Reports taking metformin  once daily, sometimes twice daily. Skips second dose if blood sugar is around 90. Denies symptoms of hypoglycemia such as weakness, dizziness, or jitteriness. Tolerates metformin  well. - Ran out of Jardiance  several months ago and has not been taking it. Reports no issues and no significant co-pay problems when previously taking it.  - Reports checking blood pressure at home. This morning's reading was 140/98 mmHg. Reports it fluctuates, with lowest readings around 120/80 mmHg.  - Due for a mammogram. - Discussed colon cancer screening.  Medications Current medications include metformin , lisinopril , and hydrochlorothiazide . Reports inconsistent use of metformin . Ran out of Jardiance  and stopped taking it.  PMH, PSH, FH, Social Hx PMHx: Diabetes Mellitus Type 2, Hypertension. FHx: Father with colon cancer. Mother and paternal family with history of dialysis.  ROS Constitutional: Denies weakness, dizziness, jitteriness with low blood sugar readings.   The 10-year ASCVD risk score (Arnett DK, et al., 2019) is: 18.7%  Health Maintenance Due  Topic Date Due   HIV Screening  Never done   Hepatitis C Screening  Never done   DTaP/Tdap/Td (1 - Tdap) Never done   Hepatitis B Vaccines 19-59 Average Risk (1 of 3 - 19+ 3-dose series) Never done   Colonoscopy  Never done   FOOT EXAM  09/16/2014   Pneumococcal Vaccine: 50+ Years (2 of 2 - PPSV23, PCV20, or PCV21) 01/05/2015   Zoster Vaccines- Shingrix (1 of 2) Never done   OPHTHALMOLOGY EXAM  07/19/2021   Mammogram  03/29/2023   Influenza Vaccine  Never done   COVID-19 Vaccine (3 - 2025-26 season) 06/09/2024       Objective:     BP (!) 175/108   Pulse 71   Ht 4' 11 (1.499 m)   Wt 121 lb (54.9 kg)   SpO2 100%   BMI 24.44 kg/m    Physical Exam Gen: alert, oriented Pulm: no respiratory distress Psych: pleasant affect   Results for orders placed or performed in visit on 09/08/24  POCT glycosylated hemoglobin (Hb A1C)  Result Value Ref Range   Hemoglobin A1C     HbA1c POC (<> result, manual entry) 8.2 4.0 - 5.6 %   HbA1c, POC (prediabetic range)     HbA1c, POC (controlled diabetic range)          Assessment & Plan:   Type 2 diabetes mellitus with hyperglycemia, without long-term current use of insulin  (HCC) Assessment & Plan: A1c was 8.2 six months ago. Reports inconsistent medication adherence with metformin  and has not taken Jardiance  for several months. Creatinine is noted to be rising, increasing concern for diabetic nephropathy. - Switch from metformin  to Synjardy (metformin /empagliflozin ). Will send 90-day supply with refills. If co-pay is an issue, will switch back to separate metformin  and Jardiance . - Labs today to check CMP to re-evaluate renal function.  Orders: -     POCT glycosylated hemoglobin (Hb A1C) -     Basic metabolic panel with GFR  Anxiety -     hydrOXYzine  Pamoate; Take 1 capsule (25 mg total) by mouth every 8 (eight) hours as needed for anxiety.  Dispense: 30 capsule; Refill: 0  Encounter for  screening mammogram for malignant neoplasm of breast -     Digital Screening Mammogram, Left and Right; Future  Encounter for colorectal cancer screening -     Ambulatory referral to Gastroenterology  Essential hypertension Assessment & Plan: Home BP readings are elevated, with a reading of 140/98 mmHg this morning. Current medication is lisinopril -HCTZ. - Discontinue lisinopril /hydrochlorothiazide . - Start olmesartan/amlodipine/hydrochlorothiazide  combination pill. Will start with a lower dose of HCTZ (12.5mg ) within the combination pill. - Will send 90-day  supply with refills.   Healthcare maintenance Assessment & Plan: - Ordered for screening mammogram. - Changed order from Cologuard to colonoscopy due to high-risk family history of colon cancer. Counseled that Cologuard is for average-risk individuals and colonoscopy is the preferred screening method.   Other orders -     Olmesartan-amLODIPine-HCTZ; Take 1 tablet by mouth daily.  Dispense: 90 tablet; Refill: 3 -     Synjardy; Take 1 tablet by mouth 2 (two) times daily.  Dispense: 180 tablet; Refill: 3     Return in about 3 months (around 12/07/2024) for DM, HTN.    Wendy MARLA Slain, MD

## 2024-09-08 NOTE — Assessment & Plan Note (Addendum)
-   Ordered for screening mammogram. - Changed order from Cologuard to colonoscopy due to high-risk family history of colon cancer. Counseled that Cologuard is for average-risk individuals and colonoscopy is the preferred screening method.

## 2024-09-09 ENCOUNTER — Ambulatory Visit: Payer: Self-pay | Admitting: Family Medicine

## 2024-09-09 LAB — BASIC METABOLIC PANEL WITH GFR
BUN/Creatinine Ratio: 15 (ref 9–23)
BUN: 15 mg/dL (ref 6–24)
CO2: 21 mmol/L (ref 20–29)
Calcium: 10 mg/dL (ref 8.7–10.2)
Chloride: 103 mmol/L (ref 96–106)
Creatinine, Ser: 0.98 mg/dL (ref 0.57–1.00)
Glucose: 171 mg/dL — ABNORMAL HIGH (ref 70–99)
Potassium: 4.7 mmol/L (ref 3.5–5.2)
Sodium: 138 mmol/L (ref 134–144)
eGFR: 68 mL/min/1.73 (ref >59)

## 2024-10-28 ENCOUNTER — Telehealth: Payer: Self-pay

## 2024-10-28 NOTE — Telephone Encounter (Signed)
 Please let pt know we can do the TB skin test on any Mon, Tues, or Wed but she has to come back to the office after 48 hours to have it read.  We can also do the blood test, which doesn't require a follow up visit. Whichever she wants to do is fine.

## 2024-10-28 NOTE — Telephone Encounter (Signed)
 Copied from CRM [id number]. Topic: Clinical - Request for Lab/Test Order >> Oct 28, 2024 11:41 AM Wendy Craig wrote: Reason for CRM: Pt called requesting have TB skin testing, please advise when orders are ready.  Called and spoke with patient; she's scheduled for lab appointment - 10/29/24/TB(blood) test.

## 2024-10-29 ENCOUNTER — Other Ambulatory Visit: Payer: Self-pay

## 2024-10-29 DIAGNOSIS — Z111 Encounter for screening for respiratory tuberculosis: Secondary | ICD-10-CM

## 2024-10-30 ENCOUNTER — Other Ambulatory Visit

## 2024-10-30 DIAGNOSIS — Z111 Encounter for screening for respiratory tuberculosis: Secondary | ICD-10-CM

## 2024-11-02 ENCOUNTER — Ambulatory Visit: Payer: Self-pay

## 2024-11-03 LAB — QUANTIFERON-TB GOLD PLUS
QuantiFERON Mitogen Value: >10 [IU]/mL
QuantiFERON Nil Value: 0.03 [IU]/mL
QuantiFERON TB1 Ag Value: 0.04 [IU]/mL
QuantiFERON TB2 Ag Value: 0.04 [IU]/mL

## 2024-12-17 ENCOUNTER — Ambulatory Visit
